# Patient Record
Sex: Female | Born: 1960
Health system: Southern US, Community
[De-identification: ages and names within clinical notes are randomized; demographics above are authoritative.]

## PROBLEM LIST (undated history)

## (undated) DIAGNOSIS — M797 Fibromyalgia: Secondary | ICD-10-CM

## (undated) DIAGNOSIS — D329 Benign neoplasm of meninges, unspecified: Secondary | ICD-10-CM

## (undated) DIAGNOSIS — M069 Rheumatoid arthritis, unspecified: Secondary | ICD-10-CM

## (undated) DIAGNOSIS — E669 Obesity, unspecified: Secondary | ICD-10-CM

## (undated) DIAGNOSIS — D332 Benign neoplasm of brain, unspecified: Secondary | ICD-10-CM

## (undated) DIAGNOSIS — M1990 Unspecified osteoarthritis, unspecified site: Secondary | ICD-10-CM

## (undated) DIAGNOSIS — G43109 Migraine with aura, not intractable, without status migrainosus: Secondary | ICD-10-CM

## (undated) DIAGNOSIS — G5712 Meralgia paresthetica, left lower limb: Secondary | ICD-10-CM

## (undated) HISTORY — DX: Unspecified osteoarthritis, unspecified site: M19.90

## (undated) HISTORY — DX: Rheumatoid arthritis, unspecified: M06.9

## (undated) HISTORY — DX: Benign neoplasm of meninges, unspecified: D32.9

## (undated) HISTORY — DX: Obesity, unspecified: E66.9

## (undated) HISTORY — PX: APPENDECTOMY: SHX54

## (undated) HISTORY — DX: Fibromyalgia: M79.7

## (undated) HISTORY — DX: Benign neoplasm of brain, unspecified: D33.2

## (undated) HISTORY — DX: Meralgia paresthetica, left lower limb: G57.12

## (undated) HISTORY — PX: PARTIAL HYSTERECTOMY: SHX80

## (undated) HISTORY — DX: Migraine with aura, not intractable, without status migrainosus: G43.109

---

## 2006-04-21 ENCOUNTER — Emergency Department (HOSPITAL_COMMUNITY): Admission: EM | Admit: 2006-04-21 | Discharge: 2006-04-21 | Payer: Self-pay | Admitting: Emergency Medicine

## 2006-04-23 ENCOUNTER — Emergency Department (HOSPITAL_COMMUNITY): Admission: EM | Admit: 2006-04-23 | Discharge: 2006-04-24 | Payer: Self-pay | Admitting: Emergency Medicine

## 2015-11-23 DIAGNOSIS — Z79899 Other long term (current) drug therapy: Secondary | ICD-10-CM | POA: Diagnosis not present

## 2015-11-23 DIAGNOSIS — M0609 Rheumatoid arthritis without rheumatoid factor, multiple sites: Secondary | ICD-10-CM | POA: Diagnosis not present

## 2015-11-23 DIAGNOSIS — R252 Cramp and spasm: Secondary | ICD-10-CM | POA: Diagnosis not present

## 2015-12-12 DIAGNOSIS — M79674 Pain in right toe(s): Secondary | ICD-10-CM | POA: Diagnosis not present

## 2015-12-12 DIAGNOSIS — L03031 Cellulitis of right toe: Secondary | ICD-10-CM | POA: Diagnosis not present

## 2015-12-12 DIAGNOSIS — M79671 Pain in right foot: Secondary | ICD-10-CM | POA: Diagnosis not present

## 2015-12-12 DIAGNOSIS — L600 Ingrowing nail: Secondary | ICD-10-CM | POA: Diagnosis not present

## 2015-12-14 DIAGNOSIS — I10 Essential (primary) hypertension: Secondary | ICD-10-CM | POA: Diagnosis not present

## 2015-12-14 DIAGNOSIS — Z882 Allergy status to sulfonamides status: Secondary | ICD-10-CM | POA: Diagnosis not present

## 2015-12-14 DIAGNOSIS — I499 Cardiac arrhythmia, unspecified: Secondary | ICD-10-CM | POA: Diagnosis not present

## 2015-12-14 DIAGNOSIS — Z888 Allergy status to other drugs, medicaments and biological substances status: Secondary | ICD-10-CM | POA: Diagnosis not present

## 2015-12-14 DIAGNOSIS — Z881 Allergy status to other antibiotic agents status: Secondary | ICD-10-CM | POA: Diagnosis not present

## 2015-12-14 DIAGNOSIS — R030 Elevated blood-pressure reading, without diagnosis of hypertension: Secondary | ICD-10-CM | POA: Diagnosis not present

## 2015-12-14 DIAGNOSIS — R0602 Shortness of breath: Secondary | ICD-10-CM | POA: Diagnosis not present

## 2015-12-14 DIAGNOSIS — R55 Syncope and collapse: Secondary | ICD-10-CM | POA: Diagnosis not present

## 2015-12-14 DIAGNOSIS — Z886 Allergy status to analgesic agent status: Secondary | ICD-10-CM | POA: Diagnosis not present

## 2015-12-26 DIAGNOSIS — K210 Gastro-esophageal reflux disease with esophagitis: Secondary | ICD-10-CM | POA: Diagnosis not present

## 2015-12-26 DIAGNOSIS — I10 Essential (primary) hypertension: Secondary | ICD-10-CM | POA: Diagnosis not present

## 2015-12-26 DIAGNOSIS — M0689 Other specified rheumatoid arthritis, multiple sites: Secondary | ICD-10-CM | POA: Diagnosis not present

## 2015-12-26 DIAGNOSIS — G59 Mononeuropathy in diseases classified elsewhere: Secondary | ICD-10-CM | POA: Diagnosis not present

## 2015-12-27 DIAGNOSIS — L600 Ingrowing nail: Secondary | ICD-10-CM | POA: Diagnosis not present

## 2015-12-27 DIAGNOSIS — M79671 Pain in right foot: Secondary | ICD-10-CM | POA: Diagnosis not present

## 2015-12-27 DIAGNOSIS — L03031 Cellulitis of right toe: Secondary | ICD-10-CM | POA: Diagnosis not present

## 2015-12-27 DIAGNOSIS — M79674 Pain in right toe(s): Secondary | ICD-10-CM | POA: Diagnosis not present

## 2016-01-02 DIAGNOSIS — M79671 Pain in right foot: Secondary | ICD-10-CM | POA: Diagnosis not present

## 2016-01-02 DIAGNOSIS — M79674 Pain in right toe(s): Secondary | ICD-10-CM | POA: Diagnosis not present

## 2016-01-02 DIAGNOSIS — L600 Ingrowing nail: Secondary | ICD-10-CM | POA: Diagnosis not present

## 2016-01-02 DIAGNOSIS — L03031 Cellulitis of right toe: Secondary | ICD-10-CM | POA: Diagnosis not present

## 2016-02-11 DIAGNOSIS — Z79899 Other long term (current) drug therapy: Secondary | ICD-10-CM | POA: Diagnosis not present

## 2016-02-11 DIAGNOSIS — M797 Fibromyalgia: Secondary | ICD-10-CM | POA: Diagnosis not present

## 2016-04-15 DIAGNOSIS — G59 Mononeuropathy in diseases classified elsewhere: Secondary | ICD-10-CM | POA: Diagnosis not present

## 2016-04-15 DIAGNOSIS — L2089 Other atopic dermatitis: Secondary | ICD-10-CM | POA: Diagnosis not present

## 2016-04-15 DIAGNOSIS — M0689 Other specified rheumatoid arthritis, multiple sites: Secondary | ICD-10-CM | POA: Diagnosis not present

## 2016-04-15 DIAGNOSIS — I10 Essential (primary) hypertension: Secondary | ICD-10-CM | POA: Diagnosis not present

## 2016-04-15 DIAGNOSIS — Z6834 Body mass index (BMI) 34.0-34.9, adult: Secondary | ICD-10-CM | POA: Diagnosis not present

## 2016-05-15 DIAGNOSIS — M0609 Rheumatoid arthritis without rheumatoid factor, multiple sites: Secondary | ICD-10-CM | POA: Diagnosis not present

## 2016-05-15 DIAGNOSIS — M064 Inflammatory polyarthropathy: Secondary | ICD-10-CM | POA: Diagnosis not present

## 2016-05-15 DIAGNOSIS — Z79899 Other long term (current) drug therapy: Secondary | ICD-10-CM | POA: Diagnosis not present

## 2016-07-15 DIAGNOSIS — M0609 Rheumatoid arthritis without rheumatoid factor, multiple sites: Secondary | ICD-10-CM | POA: Diagnosis not present

## 2016-07-15 DIAGNOSIS — G59 Mononeuropathy in diseases classified elsewhere: Secondary | ICD-10-CM | POA: Diagnosis not present

## 2016-07-15 DIAGNOSIS — J440 Chronic obstructive pulmonary disease with acute lower respiratory infection: Secondary | ICD-10-CM | POA: Diagnosis not present

## 2016-07-15 DIAGNOSIS — K210 Gastro-esophageal reflux disease with esophagitis: Secondary | ICD-10-CM | POA: Diagnosis not present

## 2016-08-04 DIAGNOSIS — Z79899 Other long term (current) drug therapy: Secondary | ICD-10-CM | POA: Diagnosis not present

## 2016-08-04 DIAGNOSIS — M0609 Rheumatoid arthritis without rheumatoid factor, multiple sites: Secondary | ICD-10-CM | POA: Diagnosis not present

## 2016-08-04 DIAGNOSIS — M797 Fibromyalgia: Secondary | ICD-10-CM | POA: Diagnosis not present

## 2016-09-26 DIAGNOSIS — M0609 Rheumatoid arthritis without rheumatoid factor, multiple sites: Secondary | ICD-10-CM | POA: Diagnosis not present

## 2016-09-26 DIAGNOSIS — Z79899 Other long term (current) drug therapy: Secondary | ICD-10-CM | POA: Diagnosis not present

## 2016-09-26 DIAGNOSIS — M797 Fibromyalgia: Secondary | ICD-10-CM | POA: Diagnosis not present

## 2016-10-16 DIAGNOSIS — Z131 Encounter for screening for diabetes mellitus: Secondary | ICD-10-CM | POA: Diagnosis not present

## 2016-10-16 DIAGNOSIS — J440 Chronic obstructive pulmonary disease with acute lower respiratory infection: Secondary | ICD-10-CM | POA: Diagnosis not present

## 2016-10-16 DIAGNOSIS — K210 Gastro-esophageal reflux disease with esophagitis: Secondary | ICD-10-CM | POA: Diagnosis not present

## 2016-10-16 DIAGNOSIS — M0609 Rheumatoid arthritis without rheumatoid factor, multiple sites: Secondary | ICD-10-CM | POA: Diagnosis not present

## 2016-10-16 DIAGNOSIS — I10 Essential (primary) hypertension: Secondary | ICD-10-CM | POA: Diagnosis not present

## 2016-10-16 DIAGNOSIS — G59 Mononeuropathy in diseases classified elsewhere: Secondary | ICD-10-CM | POA: Diagnosis not present

## 2016-10-16 DIAGNOSIS — Z6837 Body mass index (BMI) 37.0-37.9, adult: Secondary | ICD-10-CM | POA: Diagnosis not present

## 2016-11-13 DIAGNOSIS — K2900 Acute gastritis without bleeding: Secondary | ICD-10-CM | POA: Diagnosis not present

## 2016-11-13 DIAGNOSIS — Z6837 Body mass index (BMI) 37.0-37.9, adult: Secondary | ICD-10-CM | POA: Diagnosis not present

## 2016-11-13 DIAGNOSIS — J440 Chronic obstructive pulmonary disease with acute lower respiratory infection: Secondary | ICD-10-CM | POA: Diagnosis not present

## 2016-11-13 DIAGNOSIS — D320 Benign neoplasm of cerebral meninges: Secondary | ICD-10-CM | POA: Diagnosis not present

## 2016-11-27 DIAGNOSIS — R51 Headache: Secondary | ICD-10-CM | POA: Diagnosis not present

## 2016-11-27 DIAGNOSIS — R110 Nausea: Secondary | ICD-10-CM | POA: Diagnosis not present

## 2016-11-27 DIAGNOSIS — R42 Dizziness and giddiness: Secondary | ICD-10-CM | POA: Diagnosis not present

## 2016-12-29 DIAGNOSIS — M0609 Rheumatoid arthritis without rheumatoid factor, multiple sites: Secondary | ICD-10-CM | POA: Diagnosis not present

## 2016-12-29 DIAGNOSIS — Z79899 Other long term (current) drug therapy: Secondary | ICD-10-CM | POA: Diagnosis not present

## 2016-12-29 DIAGNOSIS — M797 Fibromyalgia: Secondary | ICD-10-CM | POA: Diagnosis not present

## 2016-12-30 ENCOUNTER — Ambulatory Visit: Payer: BLUE CROSS/BLUE SHIELD | Admitting: Neurology

## 2017-01-21 ENCOUNTER — Ambulatory Visit: Payer: BLUE CROSS/BLUE SHIELD | Admitting: Neurology

## 2017-01-27 ENCOUNTER — Encounter: Payer: Self-pay | Admitting: *Deleted

## 2017-01-27 ENCOUNTER — Ambulatory Visit (INDEPENDENT_AMBULATORY_CARE_PROVIDER_SITE_OTHER): Payer: BLUE CROSS/BLUE SHIELD | Admitting: Neurology

## 2017-01-27 ENCOUNTER — Encounter: Payer: Self-pay | Admitting: Neurology

## 2017-01-27 DIAGNOSIS — G43119 Migraine with aura, intractable, without status migrainosus: Secondary | ICD-10-CM | POA: Diagnosis not present

## 2017-01-27 DIAGNOSIS — G5712 Meralgia paresthetica, left lower limb: Secondary | ICD-10-CM

## 2017-01-27 DIAGNOSIS — G43109 Migraine with aura, not intractable, without status migrainosus: Secondary | ICD-10-CM

## 2017-01-27 DIAGNOSIS — D329 Benign neoplasm of meninges, unspecified: Secondary | ICD-10-CM

## 2017-01-27 HISTORY — DX: Migraine with aura, not intractable, without status migrainosus: G43.109

## 2017-01-27 HISTORY — DX: Meralgia paresthetica, left lower limb: G57.12

## 2017-01-27 HISTORY — DX: Benign neoplasm of meninges, unspecified: D32.9

## 2017-01-27 MED ORDER — SUMATRIPTAN SUCCINATE 100 MG PO TABS: 100.0000 mg | ORAL_TABLET | Freq: Two times a day (BID) | ORAL | 3 refills | Status: DC | PRN

## 2017-01-27 MED ORDER — TOPIRAMATE 25 MG PO TABS: ORAL_TABLET | ORAL | 3 refills | Status: DC

## 2017-01-27 NOTE — Patient Instructions (Signed)
   We will start Topamax to take daily to prevent the headache, and Imitrex to take when the headache occurs.  Topamax (topiramate) is a seizure medication that has an FDA approval for seizures and for migraine headache. Potential side effects of this medication include weight loss, cognitive slowing, tingling in the fingers and toes, and carbonated drinks will taste bad. If any significant side effects are noted on this drug, please contact our office.

## 2017-01-27 NOTE — Progress Notes (Signed)
Reason for visit: Migraine headache  Referring physician: Dr. Hassan Rowan April [name] is a 56 y.o. female  History of present illness:  April [name] is a 56 year old right-handed white female with a history of headaches that began about one year ago. She also carries a diagnosis of rheumatoid arthritis and fibromyalgia on Lyrica. She claims that her father also had severe headaches. She indicates that her headaches are occurring about 2 or 3 times a week on average and that the headaches may last several hours. The patient has noted that if she is able to lie down and take a nap it will help the headache go away. At times, she may have more than one headache in the day. The headaches are usually starting on the left frontotemporal area and may include the back of the head without neck stiffness. The patient describes the pain as a sharp discomfort associated with nausea, and occasional dizziness with true vertigo. The patient may be somewhat staggery when she is dizzy, otherwise her balance is normal. The patient reports some other issues with numbness in the fourth and fifth fingers of the left hand, she also has numbness and discomfort in the anterolateral aspect of the left leg. She does report some back pain and occasional numbness and discomfort down below the knee on the left. The patient has undergone MRI of the brain that was done at [facility] about a year ago that revealed a small left frontoparietal meningioma without compression of the brain. The MRI was reviewed on line. The patient reports no true weakness. She indicates that she drinks 6-8 glasses of iced tea daily. She denies any activating factors for her headache. She is sent to this office for an evaluation. With the headaches, the patient will have a visual component with a circle in the center the vision that will expand and then fade away. This occurs during the headache, not before.  Past Medical History:  Diagnosis Date  .  Arthritis   . Brain tumor (benign) ([place])   . Fibromyalgia     Past Surgical History:  Procedure Laterality Date  . APPENDECTOMY    . PARTIAL HYSTERECTOMY      Family History  Problem Relation Age of Onset  . COPD Mother   . COPD Father     Social history:  reports that she has quit smoking. Her smoking use included Cigarettes. She has never used smokeless tobacco. She reports that she does not drink alcohol or use drugs.  Medications:  Prior to Admission medications   Not on File      Allergies  Allergen Reactions  . Aspirin Hives  . Cephalexin Hives  . Erythromycin Base Hives  . Gabapentin     Memory difficulty  . Sulfa Antibiotics Hives    ROS:  Out of a complete 14 system review of symptoms, the patient complains only of the following symptoms, and all other reviewed systems are negative.  Swelling the legs Blurred vision Incontinence of the bladder, stress incontinence Easy bruising Allergies, runny nose Numbness, dizziness Decreased energy, hallucinations Restless legs  Blood pressure (!) 146/83, pulse 86, height 5' 1.75" (1.568 m), weight 193 lb 8 oz (87.8 kg).  Physical Exam  General: The patient is alert and cooperative at the time of the examination. The patient is moderately obese.  Eyes: Pupils are equal, round, and reactive to light. Discs are flat bilaterally.  Neck: The neck is supple, no carotid bruits are noted.  Respiratory: The  respiratory examination is clear.  Cardiovascular: The cardiovascular examination reveals a regular rate and rhythm, no obvious murmurs or rubs are noted.  Neuromuscular: Range of movement the cervical spine is full, no crepitus is noted in the temporomandibular joints.  Skin: Extremities are without significant edema.  Neurologic Exam  Mental status: The patient is alert and oriented x 3 at the time of the examination. The patient has apparent normal recent and remote memory, with an apparently normal  attention span and concentration ability.  Cranial nerves: Facial symmetry is present. There is good sensation of the face to pinprick and soft touch bilaterally. The strength of the facial muscles and the muscles to head turning and shoulder shrug are normal bilaterally. Speech is well enunciated, no aphasia or dysarthria is noted. Extraocular movements are full. Visual fields are full. The tongue is midline, and the patient has symmetric elevation of the soft palate. No obvious hearing deficits are noted.  Motor: The motor testing reveals 5 over 5 strength of all 4 extremities. Good symmetric motor tone is noted throughout.  Sensory: Sensory testing is intact to pinprick, soft touch, vibration sensation, and position sense on all 4 extremities, with exception of some decreased pinprick sensation on the left leg below the knee. No evidence of extinction is noted.  Coordination: Cerebellar testing reveals good finger-nose-finger and heel-to-shin bilaterally.  Gait and station: Gait is associated with a limping quality on the left leg. Tandem gait is normal. Romberg is negative. No drift is seen.  Reflexes: Deep tendon reflexes are symmetric and normal bilaterally. Toes are downgoing bilaterally.   Assessment/Plan:  1. Classic migraine headache  2. Left meralgia paresthetica  The patient is having episodic migraine headaches. She will be placed on Topamax working up to 75 mg daily dose, Imitrex will be given to take if needed. The patient is drinking 6-8 glasses of iced tea daily, I have asked her to cut back on this. The patient will follow-up in 3 months. She will call for any dose adjustments of the medication.  April Alexanders MD 01/27/2017 9:31 AM  [facility] [address] [place], [place] [address]  Phone [phone] Fax [phone]

## 2017-02-19 DIAGNOSIS — J440 Chronic obstructive pulmonary disease with acute lower respiratory infection: Secondary | ICD-10-CM | POA: Diagnosis not present

## 2017-02-19 DIAGNOSIS — Z6836 Body mass index (BMI) 36.0-36.9, adult: Secondary | ICD-10-CM | POA: Diagnosis not present

## 2017-02-19 DIAGNOSIS — I10 Essential (primary) hypertension: Secondary | ICD-10-CM | POA: Diagnosis not present

## 2017-02-20 DIAGNOSIS — R2689 Other abnormalities of gait and mobility: Secondary | ICD-10-CM | POA: Diagnosis not present

## 2017-02-20 DIAGNOSIS — M797 Fibromyalgia: Secondary | ICD-10-CM | POA: Diagnosis not present

## 2017-02-20 DIAGNOSIS — M545 Low back pain: Secondary | ICD-10-CM | POA: Diagnosis not present

## 2017-02-20 DIAGNOSIS — M0609 Rheumatoid arthritis without rheumatoid factor, multiple sites: Secondary | ICD-10-CM | POA: Diagnosis not present

## 2017-03-13 ENCOUNTER — Telehealth: Payer: Self-pay | Admitting: Neurology

## 2017-03-13 MED ORDER — PROPRANOLOL HCL 20 MG PO TABS: 20.0000 mg | ORAL_TABLET | Freq: Two times a day (BID) | ORAL | 3 refills | Status: DC

## 2017-03-13 NOTE — Telephone Encounter (Signed)
I called patient. The patient has had diarrhea on the Topamax at 75 mg dosing. She will taper by 25 mg every 5 days until she stops the medication, I will start low-dose propranolol for the headache.  The patient claims that the Topamax did help the headache.

## 2017-03-13 NOTE — Telephone Encounter (Signed)
LMVM for pt that returned her call.  Will forward to Dr. Jannifer Franklin as well.

## 2017-03-13 NOTE — Telephone Encounter (Signed)
Pt called the clinic she is having severe diarreah with topiramate (TOPAMAX) 25 MG tablet. Please call.

## 2017-04-02 DIAGNOSIS — M0609 Rheumatoid arthritis without rheumatoid factor, multiple sites: Secondary | ICD-10-CM | POA: Diagnosis not present

## 2017-04-02 DIAGNOSIS — R2689 Other abnormalities of gait and mobility: Secondary | ICD-10-CM | POA: Diagnosis not present

## 2017-04-02 DIAGNOSIS — M064 Inflammatory polyarthropathy: Secondary | ICD-10-CM | POA: Diagnosis not present

## 2017-04-02 DIAGNOSIS — Z79899 Other long term (current) drug therapy: Secondary | ICD-10-CM | POA: Diagnosis not present

## 2017-04-02 DIAGNOSIS — M5136 Other intervertebral disc degeneration, lumbar region: Secondary | ICD-10-CM | POA: Diagnosis not present

## 2017-04-20 DIAGNOSIS — M5416 Radiculopathy, lumbar region: Secondary | ICD-10-CM | POA: Diagnosis not present

## 2017-04-20 DIAGNOSIS — M4726 Other spondylosis with radiculopathy, lumbar region: Secondary | ICD-10-CM | POA: Diagnosis not present

## 2017-04-20 DIAGNOSIS — M791 Myalgia: Secondary | ICD-10-CM | POA: Diagnosis not present

## 2017-04-21 ENCOUNTER — Encounter: Payer: Self-pay | Admitting: Adult Health

## 2017-04-30 ENCOUNTER — Ambulatory Visit: Payer: BLUE CROSS/BLUE SHIELD | Admitting: Adult Health

## 2017-05-04 ENCOUNTER — Encounter: Payer: Self-pay | Admitting: Adult Health

## 2017-05-04 ENCOUNTER — Ambulatory Visit (INDEPENDENT_AMBULATORY_CARE_PROVIDER_SITE_OTHER): Payer: BLUE CROSS/BLUE SHIELD | Admitting: Adult Health

## 2017-05-04 VITALS — BP 136/72 | HR 72 | Ht 61.75 in | Wt 191.6 lb

## 2017-05-04 DIAGNOSIS — R251 Tremor, unspecified: Secondary | ICD-10-CM | POA: Diagnosis not present

## 2017-05-04 DIAGNOSIS — G43019 Migraine without aura, intractable, without status migrainosus: Secondary | ICD-10-CM

## 2017-05-04 DIAGNOSIS — M21372 Foot drop, left foot: Secondary | ICD-10-CM | POA: Diagnosis not present

## 2017-05-04 MED ORDER — TOPIRAMATE 25 MG PO TABS: 75.0000 mg | ORAL_TABLET | Freq: Every day | ORAL | 3 refills | Status: DC

## 2017-05-04 NOTE — Patient Instructions (Signed)
Your Plan:  Increase Topamax 75 mg daily. If diarrhea starts then we will switch you to propranolol  Will have Dr. Jannifer Franklin review MRI in regards to Tremor.  If your symptoms worsen or you develop new symptoms please let us know.   Thank you for coming to see [place] at [facility]. I hope we have been able to provide you high quality care today.  You may receive a patient satisfaction survey over the next few weeks. We would appreciate your feedback and comments so that we may continue to improve ourselves and the health of our patients.

## 2017-05-04 NOTE — Progress Notes (Signed)
I have read the note, and I agree with the clinical assessment and plan.  Fleur Audino KEITH   

## 2017-05-04 NOTE — Progress Notes (Signed)
PATIENT: April [name] DOB: 19-Aug-1960  REASON FOR VISIT: follow up HISTORY FROM: patient  HISTORY OF PRESENT ILLNESS: Today 05/04/17 April [name] is a 56 year old female with a history of headaches. She returns today for follow-up. She was started on Topamax 75 mg at bedtime. She reports that she tolerated the first bottle of bmedication well. However when she got her second prescription she developed diarrhea on this medication. According to the phone note she was instructed to wean off the Topamax and start propranolol however the patient did not start propranolol but rather decreased Topamax to 50 mg at bedtime. She reports that she is tolerating this well. She states that she does feel that it helped with her headache frequency and severity. She states that when she does get a headache it can last 4- 5 days. She tends to have dizziness as well as nausea, photophobia and phonophobia with her headaches. She states that if she can lay down in a dark room and sleep her headache typically will resolve. She did try Imitrex but it caused severe nausea and vomiting. She also states that she has a tremor in the right arm. She states this is been going on for quite some time. Reports that her rheumatologist noticed that and advised that she should discuss with neurology. The patient denies any history of tremor. The patient feels that the tremor occurs at any time. Not just with action. She does feel that he gets worse when she is out and about. The patient also states that she has weakness in the left leg. She states that she continues to have numbness in the left thigh. She uses a cane when ambulating. Reports that she was sent to pain management and they completed MRI of the lumbar spine. I have not reviewed the report or images. She returns today for an evaluation.  HISTORY 01/27/17: April [name] is a 56 year old right-handed white female with a history of headaches that began about one year ago. She also  carries a diagnosis of rheumatoid arthritis and fibromyalgia on Lyrica. She claims that her father also had severe headaches. She indicates that her headaches are occurring about 2 or 3 times a week on average and that the headaches may last several hours. The patient has noted that if she is able to lie down and take a nap it will help the headache go away. At times, she may have more than one headache in the day. The headaches are usually starting on the left frontotemporal area and may include the back of the head without neck stiffness. The patient describes the pain as a sharp discomfort associated with nausea, and occasional dizziness with true vertigo. The patient may be somewhat staggery when she is dizzy, otherwise her balance is normal. The patient reports some other issues with numbness in the fourth and fifth fingers of the left hand, she also has numbness and discomfort in the anterolateral aspect of the left leg. She does report some back pain and occasional numbness and discomfort down below the knee on the left. The patient has undergone MRI of the brain that was done at [facility] about a year ago that revealed a small left frontoparietal meningioma without compression of the brain. The MRI was reviewed on line. The patient reports no true weakness. She indicates that she drinks 6-8 glasses of iced tea daily. She denies any activating factors for her headache. She is sent to this office for an evaluation. With the headaches, the patient  will have a visual component with a circle in the center the vision that will expand and then fade away. This occurs during the headache, not before.  REVIEW OF SYSTEMS: Out of a complete 14 system review of symptoms, the patient complains only of the following symptoms, and all other reviewed systems are negative.  See history of present illness  ALLERGIES: Allergies  Allergen Reactions  . Aspirin Hives  . Cephalexin Hives  . Erythromycin Base  Hives  . Gabapentin     Memory difficulty  . Sulfa Antibiotics Hives    HOME MEDICATIONS: Outpatient Medications Prior to Visit  Medication Sig Dispense Refill  . albuterol (PROAIR HFA) 108 (90 Base) MCG/ACT inhaler 2 puffs 4 (four) times daily.    . cyclobenzaprine (FLEXERIL) 5 MG tablet Take 5 mg by mouth at bedtime.    . famotidine (PEPCID) 20 MG tablet Take 20 mg by mouth. Take two tablets daily.    . folic acid (FOLVITE) 1 MG tablet Take 1 mg by mouth daily.    . hydroxychloroquine (PLAQUENIL) 200 MG tablet hydroxychloroquine 200 mg tablet  Take 2 tablets every day by oral route.    Marland Kitchen ipratropium-albuterol (DUONEB) 0.5-2.5 (3) MG/3ML SOLN 4 (four) times daily.    . methotrexate (RHEUMATREX) 2.5 MG tablet Take 25 mg by mouth once a week.    . Omega-3 Fatty Acids (FISH OIL PO) Take 1,400 mg by mouth. Take two tablets daily.    . pregabalin (LYRICA) 75 MG capsule Lyrica 75 mg capsule  Take 1 capsule 4 times a day by oral route.    . propranolol (INDERAL) 20 MG tablet Take 1 tablet (20 mg total) by mouth 2 (two) times daily. 60 tablet 3  . predniSONE (DELTASONE) 1 MG tablet prednisone 1 mg tablet  Take 1-2 tabs po daily    . SUMAtriptan (IMITREX) 100 MG tablet Take 1 tablet (100 mg total) by mouth 2 (two) times daily as needed for migraine. 10 tablet 3   No facility-administered medications prior to visit.     PAST MEDICAL HISTORY: Past Medical History:  Diagnosis Date  . Arthritis   . Brain tumor (benign) ([place])   . Classic migraine with aura 01/27/2017  . Fibromyalgia   . Meningioma ([place]) 01/27/2017  . Meralgia paresthetica of left side 01/27/2017  . Obesity   . Rheumatoid arthritis ([place])     PAST SURGICAL HISTORY: Past Surgical History:  Procedure Laterality Date  . APPENDECTOMY    . PARTIAL HYSTERECTOMY      FAMILY HISTORY: Family History  Problem Relation Age of Onset  . COPD Mother   . COPD Father   . Migraines Father     SOCIAL HISTORY: Social History    Social History  . Marital status: Married    Spouse name: N/A  . Number of children: 3  . Years of education: 7th grade   Occupational History  . Homemaker    Social History Main Topics  . Smoking status: Former Smoker    Types: Cigarettes  . Smokeless tobacco: Never Used     Comment: Quit 6 months  . Alcohol use No  . Drug use: No  . Sexual activity: Not on file   Other Topics Concern  . Not on file   Social History Narrative   Lives at home with husband.   Right-handed.   4 glasses of tea per day.      PHYSICAL EXAM  Vitals:   05/04/17 0723  BP: 136/72  Pulse: 72  Weight: 191 lb 9.6 oz (86.9 kg)  Height: 5' 1.75" (1.568 m)   Body mass index is 35.33 kg/m.  Generalized: Well developed, in no acute distress   Neurological examination  Mentation: Alert oriented to time, place, history taking. Follows all commands speech and language fluent Cranial nerve II-XII: Pupils were equal round reactive to light. Extraocular movements were full, visual field were full on confrontational test. Facial sensation and strength were normal. Uvula tongue midline. Head turning and shoulder shrug  were normal and symmetric. Motor: The motor testing reveals 5 over 5 strength in the LUE, RUE, RLE. 4/5 strength in the LLE. Mild foot drop in the left. Sensory: Sensory testing is intact to soft touch on all 4 extremities. No evidence of extinction is noted.  Coordination: Cerebellar testing reveals good finger-nose-finger and heel-to-shin bilaterally.  Gait and station: Patient uses a cane when ambulating. Slight limping on the left leg. Tandem gait not attempted. Reflexes: Deep tendon reflexes are symmetric and normal bilaterally.   DIAGNOSTIC DATA (LABS, IMAGING, TESTING) - I reviewed patient records, labs, notes, testing and imaging myself where available.    ASSESSMENT AND PLAN 56 y.o. year old female  has a past medical history of Arthritis; Brain tumor (benign) ([place]);  Classic migraine with aura (01/27/2017); Fibromyalgia; Meningioma ([place]) (01/27/2017); Meralgia paresthetica of left side (01/27/2017); Obesity; and Rheumatoid arthritis ([place]). here with  1. Migraine 2. Tremor 3. left foot drop  The patient would like to try increasing Topamax 75 mg to see if it offers any benefit with her migraines. I advised that if she develops diarrhea this medication will have to be discontinued and she will be started on propranolol. She is amenable to this plan. We will follow her tremor conservatively. I did not notice a tremor on exam today. I did discuss with Dr. Jannifer Franklin in regards to her MRI that did show a small meningioma. At this time we do not feel that this is the cause of the tremor. The patient also has a left foot drop. Apparently pain management has completed an MRI of the lumbar spine. According to the patient they are managing these symptoms at this time. I advised that if she could have her imaging and notes sent to Dr. Jannifer Franklin if she would like this evaluated by our office. She voiced understanding. She will follow-up in 4 months or sooner if needed.   Lacasse Givens, MSN, NP-C 05/04/2017, 7:30 AM [facility] [address] [place],  [address] [phone]

## 2017-05-14 ENCOUNTER — Encounter: Payer: Self-pay | Admitting: Adult Health

## 2017-05-14 ENCOUNTER — Telehealth: Payer: Self-pay | Admitting: Adult Health

## 2017-05-14 DIAGNOSIS — M545 Low back pain: Secondary | ICD-10-CM | POA: Diagnosis not present

## 2017-05-14 DIAGNOSIS — Z79891 Long term (current) use of opiate analgesic: Secondary | ICD-10-CM | POA: Diagnosis not present

## 2017-05-14 NOTE — Telephone Encounter (Signed)
Called patient LVM. Have questions regarding her email.

## 2017-05-14 NOTE — Telephone Encounter (Signed)
Pt returned NP's call. °

## 2017-05-18 NOTE — Telephone Encounter (Signed)
I called the patient left VM advising her to call the office.

## 2017-05-19 MED ORDER — TOPIRAMATE 25 MG PO TABS: 50.0000 mg | ORAL_TABLET | Freq: Every day | ORAL | 3 refills | Status: DC

## 2017-05-19 NOTE — Telephone Encounter (Signed)
I called the patient. She does state that she has asthma. For that reason we will not start propranolol. She reports that she reduced Topamax to 50 mg and the diarrhea has stopped. She also states that this is controlling her headaches. For now she will remain on Topamax 50 mg if her headache frequency increases she will let us know.

## 2017-05-19 NOTE — Addendum Note (Signed)
Addended by: Trudie Buckler on: 05/19/2017 04:05 PM   Modules accepted: Orders

## 2017-05-19 NOTE — Telephone Encounter (Signed)
Pt has returned the call to NP [facility].  I relayed message to pt from [facility] that she will call her after 3:30.  Pt okayed

## 2017-06-12 DIAGNOSIS — M791 Myalgia, unspecified site: Secondary | ICD-10-CM | POA: Diagnosis not present

## 2017-06-12 DIAGNOSIS — M4726 Other spondylosis with radiculopathy, lumbar region: Secondary | ICD-10-CM | POA: Diagnosis not present

## 2017-06-12 DIAGNOSIS — M5416 Radiculopathy, lumbar region: Secondary | ICD-10-CM | POA: Diagnosis not present

## 2017-06-12 DIAGNOSIS — M5136 Other intervertebral disc degeneration, lumbar region: Secondary | ICD-10-CM | POA: Diagnosis not present

## 2017-06-12 DIAGNOSIS — Z79899 Other long term (current) drug therapy: Secondary | ICD-10-CM | POA: Diagnosis not present

## 2017-06-12 DIAGNOSIS — M0609 Rheumatoid arthritis without rheumatoid factor, multiple sites: Secondary | ICD-10-CM | POA: Diagnosis not present

## 2017-06-12 DIAGNOSIS — M064 Inflammatory polyarthropathy: Secondary | ICD-10-CM | POA: Diagnosis not present

## 2017-06-12 DIAGNOSIS — M797 Fibromyalgia: Secondary | ICD-10-CM | POA: Diagnosis not present

## 2017-08-06 DIAGNOSIS — Z79891 Long term (current) use of opiate analgesic: Secondary | ICD-10-CM | POA: Diagnosis not present

## 2017-08-06 DIAGNOSIS — Z79899 Other long term (current) drug therapy: Secondary | ICD-10-CM | POA: Diagnosis not present

## 2017-08-06 DIAGNOSIS — M0609 Rheumatoid arthritis without rheumatoid factor, multiple sites: Secondary | ICD-10-CM | POA: Diagnosis not present

## 2017-08-06 DIAGNOSIS — M791 Myalgia, unspecified site: Secondary | ICD-10-CM | POA: Diagnosis not present

## 2017-08-06 DIAGNOSIS — Z111 Encounter for screening for respiratory tuberculosis: Secondary | ICD-10-CM | POA: Diagnosis not present

## 2017-08-06 DIAGNOSIS — M064 Inflammatory polyarthropathy: Secondary | ICD-10-CM | POA: Diagnosis not present

## 2017-08-06 DIAGNOSIS — M5416 Radiculopathy, lumbar region: Secondary | ICD-10-CM | POA: Diagnosis not present

## 2017-08-06 DIAGNOSIS — Z1159 Encounter for screening for other viral diseases: Secondary | ICD-10-CM | POA: Diagnosis not present

## 2017-08-06 DIAGNOSIS — M4726 Other spondylosis with radiculopathy, lumbar region: Secondary | ICD-10-CM | POA: Diagnosis not present

## 2017-09-07 ENCOUNTER — Encounter: Payer: Self-pay | Admitting: Adult Health

## 2017-09-07 ENCOUNTER — Ambulatory Visit: Payer: BLUE CROSS/BLUE SHIELD | Admitting: Adult Health

## 2017-09-07 VITALS — Ht 61.75 in | Wt 189.8 lb

## 2017-09-07 DIAGNOSIS — R251 Tremor, unspecified: Secondary | ICD-10-CM

## 2017-09-07 DIAGNOSIS — G43009 Migraine without aura, not intractable, without status migrainosus: Secondary | ICD-10-CM | POA: Diagnosis not present

## 2017-09-07 DIAGNOSIS — Z86011 Personal history of benign neoplasm of the brain: Secondary | ICD-10-CM

## 2017-09-07 MED ORDER — TOPIRAMATE 25 MG PO TABS: 50.0000 mg | ORAL_TABLET | Freq: Every day | ORAL | 11 refills | Status: DC

## 2017-09-07 NOTE — Progress Notes (Signed)
PATIENT: April [name] DOB: 03-04-1961  REASON FOR VISIT: follow up HISTORY FROM: patient  HISTORY OF PRESENT ILLNESS: Today 09/07/17 April [name] is a 57 year old female with a history of headaches.  She returns today for follow-up.  She states that she is able to tolerate Topamax 50 mg at bedtime.  She states that this controls her headache.  She only has approximately 1 headache a month and is very mild.  She states with her more severe headache she will have photophobia, phonophobia and nausea.  She states that she was unable to tolerate Imitrex.  She continues to have a tremor that affects the right arm.  She states the tremor is most notable when she is out in public typically in a crowd of people.  She does report that she has noticed that at home or more commonly when she is in public.  She returns today for follow-up.  HISTORY 05/04/17 April [name] is a 57 year old female with a history of headaches. She returns today for follow-up. She was started on Topamax 75 mg at bedtime. She reports that she tolerated the first bottle of bmedication well. However when she got her second prescription she developed diarrhea on this medication. According to the phone note she was instructed to wean off the Topamax and start propranolol however the patient did not start propranolol but rather decreased Topamax to 50 mg at bedtime. She reports that she is tolerating this well. She states that she does feel that it helped with her headache frequency and severity. She states that when she does get a headache it can last 4- 5 days. She tends to have dizziness as well as nausea, photophobia and phonophobia with her headaches. She states that if she can lay down in a dark room and sleep her headache typically will resolve. She did try Imitrex but it caused severe nausea and vomiting. She also states that she has a tremor in the right arm. She states this is been going on for quite some time. Reports that her  rheumatologist noticed that and advised that she should discuss with neurology. The patient denies any history of tremor. The patient feels that the tremor occurs at any time. Not just with action. She does feel that he gets worse when she is out and about. The patient also states that she has weakness in the left leg. She states that she continues to have numbness in the left thigh. She uses a cane when ambulating. Reports that she was sent to pain management and they completed MRI of the lumbar spine. I have not reviewed the report or images. She returns today for an evaluation.   REVIEW OF SYSTEMS: Out of a complete 14 system review of symptoms, the patient complains only of the following symptoms, and all other reviewed systems are negative.  ALLERGIES: Allergies  Allergen Reactions  . Aspirin Hives  . Cephalexin Hives  . Erythromycin Base Hives  . Gabapentin     Memory difficulty  . Sulfa Antibiotics Hives    HOME MEDICATIONS: Outpatient Medications Prior to Visit  Medication Sig Dispense Refill  . albuterol (PROAIR HFA) 108 (90 Base) MCG/ACT inhaler 2 puffs 4 (four) times daily.    . cyclobenzaprine (FLEXERIL) 5 MG tablet Take 5 mg by mouth at bedtime.    . famotidine (PEPCID) 20 MG tablet Take 20 mg by mouth. Take two tablets daily.    . folic acid (FOLVITE) 1 MG tablet Take 1 mg by mouth daily.    Marland Kitchen  HYDROcodone-acetaminophen (NORCO) 5-325 MG tablet Taking 1 TID PRN    . hydroxychloroquine (PLAQUENIL) 200 MG tablet hydroxychloroquine 200 mg tablet  Take 2 tablets every day by oral route.    Marland Kitchen ipratropium-albuterol (DUONEB) 0.5-2.5 (3) MG/3ML SOLN 4 (four) times daily.    . methotrexate (RHEUMATREX) 2.5 MG tablet Take 25 mg by mouth once a week.    . Omega-3 Fatty Acids (FISH OIL PO) Take 1,400 mg by mouth. Take two tablets daily.    . predniSONE (DELTASONE) 5 MG tablet Take 5 mg by mouth daily.    . pregabalin (LYRICA) 75 MG capsule Lyrica 75 mg capsule  Take 1 capsule 4 times  a day by oral route.    . topiramate (TOPAMAX) 25 MG tablet Take 2 tablets (50 mg total) by mouth at bedtime. (Patient not taking: Reported on 09/07/2017) 90 tablet 3   No facility-administered medications prior to visit.     PAST MEDICAL HISTORY: Past Medical History:  Diagnosis Date  . Arthritis   . Brain tumor (benign) ([place])   . Classic migraine with aura 01/27/2017  . Fibromyalgia   . Meningioma ([place]) 01/27/2017  . Meralgia paresthetica of left side 01/27/2017  . Obesity   . Rheumatoid arthritis ([place])     PAST SURGICAL HISTORY: Past Surgical History:  Procedure Laterality Date  . APPENDECTOMY    . PARTIAL HYSTERECTOMY      FAMILY HISTORY: Family History  Problem Relation Age of Onset  . COPD Mother   . COPD Father   . Migraines Father     SOCIAL HISTORY: Social History   Socioeconomic History  . Marital status: Married    Spouse name: Not on file  . Number of children: 3  . Years of education: 7th grade  . Highest education level: Not on file  Social Needs  . Financial resource strain: Not on file  . Food insecurity - worry: Not on file  . Food insecurity - inability: Not on file  . Transportation needs - medical: Not on file  . Transportation needs - non-medical: Not on file  Occupational History  . Occupation: Homemaker  Tobacco Use  . Smoking status: Former Smoker    Types: Cigarettes  . Smokeless tobacco: Never Used  . Tobacco comment: Quit 6 months  Substance and Sexual Activity  . Alcohol use: No  . Drug use: No  . Sexual activity: Not on file  Other Topics Concern  . Not on file  Social History Narrative   Lives at home with husband.   Right-handed.   4 glasses of tea per day.      PHYSICAL EXAM  Vitals:   09/07/17 1321  Weight: 189 lb 12.8 oz (86.1 kg)  Height: 5' 1.75" (1.568 m)   Body mass index is 35 kg/m.  Generalized: Well developed, in no acute distress   Neurological examination  Mentation: Alert oriented to time, place,  history taking. Follows all commands speech and language fluent Cranial nerve II-XII: Pupils were equal round reactive to light. Extraocular movements were full, visual field were full on confrontational test. Facial sensation and strength were normal. Uvula tongue midline. Head turning and shoulder shrug  were normal and symmetric. Motor: The motor testing reveals 5 over 5 strength of all 4 extremities. Good symmetric motor tone is noted throughout.  Right foot drop Sensory: Sensory testing is intact to soft touch on all 4 extremities. No evidence of extinction is noted.  Coordination: Cerebellar testing reveals good finger-nose-finger and  heel-to-shin bilaterally.  Gait and station: Patient uses a cane when ambulating.  Slight right foot drop and affects her gait.  Tandem gait not attempted. Reflexes: Deep tendon reflexes are symmetric and normal bilaterally.   DIAGNOSTIC DATA (LABS, IMAGING, TESTING) - I reviewed patient records, labs, notes, testing and imaging myself where available.     ASSESSMENT AND PLAN 57 y.o. year old female  has a past medical history of Arthritis, Brain tumor (benign) ([place]), Classic migraine with aura (01/27/2017), Fibromyalgia, Meningioma ([place]) (01/27/2017), Meralgia paresthetica of left side (01/27/2017), Obesity, and Rheumatoid arthritis ([place]). here with:  1.  Migraine headaches 2.  History of meningioma 3. tremor  Patient will continue on Topamax 50 mg at bedtime.  Advised that if her headache frequency or severity worsen she should let us know.  In the future we will repeat an MRI of the brain to evaluate meningioma of the brain.  We will continue to monitor tremor.  She is advised that if her symptoms worsen or she develops new symptoms she should let us know.  She will follow-up in 6 months or sooner if needed.    Nachtigal Givens, MSN, NP-C 09/07/2017, 1:40 PM [facility] [address] [place], [place] [address] [phone]

## 2017-09-07 NOTE — Progress Notes (Signed)
I have read the note, and I agree with the clinical assessment and plan.  Charles K Willis   

## 2017-09-07 NOTE — Patient Instructions (Addendum)
Your Plan:  Continue Topamax  Continue to monitor tremor If your symptoms worsen or you develop new symptoms please let us know.     Thank you for coming to see [place] at [facility]. I hope we have been able to provide you high quality care today.  You may receive a patient satisfaction survey over the next few weeks. We would appreciate your feedback and comments so that we may continue to improve ourselves and the health of our patients.

## 2017-11-05 DIAGNOSIS — M4726 Other spondylosis with radiculopathy, lumbar region: Secondary | ICD-10-CM | POA: Diagnosis not present

## 2017-11-05 DIAGNOSIS — M064 Inflammatory polyarthropathy: Secondary | ICD-10-CM | POA: Diagnosis not present

## 2017-11-05 DIAGNOSIS — Z79899 Other long term (current) drug therapy: Secondary | ICD-10-CM | POA: Diagnosis not present

## 2017-11-05 DIAGNOSIS — G894 Chronic pain syndrome: Secondary | ICD-10-CM | POA: Diagnosis not present

## 2017-11-05 DIAGNOSIS — M797 Fibromyalgia: Secondary | ICD-10-CM | POA: Diagnosis not present

## 2017-11-05 DIAGNOSIS — M5136 Other intervertebral disc degeneration, lumbar region: Secondary | ICD-10-CM | POA: Diagnosis not present

## 2017-11-05 DIAGNOSIS — M791 Myalgia, unspecified site: Secondary | ICD-10-CM | POA: Diagnosis not present

## 2017-11-05 DIAGNOSIS — M0609 Rheumatoid arthritis without rheumatoid factor, multiple sites: Secondary | ICD-10-CM | POA: Diagnosis not present

## 2017-11-05 DIAGNOSIS — M5416 Radiculopathy, lumbar region: Secondary | ICD-10-CM | POA: Diagnosis not present

## 2017-12-07 DIAGNOSIS — R300 Dysuria: Secondary | ICD-10-CM | POA: Diagnosis not present

## 2017-12-07 DIAGNOSIS — N390 Urinary tract infection, site not specified: Secondary | ICD-10-CM | POA: Diagnosis not present

## 2018-02-17 DIAGNOSIS — M064 Inflammatory polyarthropathy: Secondary | ICD-10-CM | POA: Diagnosis not present

## 2018-02-17 DIAGNOSIS — M5136 Other intervertebral disc degeneration, lumbar region: Secondary | ICD-10-CM | POA: Diagnosis not present

## 2018-02-17 DIAGNOSIS — M797 Fibromyalgia: Secondary | ICD-10-CM | POA: Diagnosis not present

## 2018-02-17 DIAGNOSIS — Z79899 Other long term (current) drug therapy: Secondary | ICD-10-CM | POA: Diagnosis not present

## 2018-02-17 DIAGNOSIS — M0609 Rheumatoid arthritis without rheumatoid factor, multiple sites: Secondary | ICD-10-CM | POA: Diagnosis not present

## 2018-02-22 DIAGNOSIS — M797 Fibromyalgia: Secondary | ICD-10-CM | POA: Diagnosis not present

## 2018-02-22 DIAGNOSIS — Z79899 Other long term (current) drug therapy: Secondary | ICD-10-CM | POA: Diagnosis not present

## 2018-02-22 DIAGNOSIS — M5416 Radiculopathy, lumbar region: Secondary | ICD-10-CM | POA: Diagnosis not present

## 2018-02-22 DIAGNOSIS — M4726 Other spondylosis with radiculopathy, lumbar region: Secondary | ICD-10-CM | POA: Diagnosis not present

## 2018-02-22 DIAGNOSIS — M791 Myalgia, unspecified site: Secondary | ICD-10-CM | POA: Diagnosis not present

## 2018-03-09 ENCOUNTER — Ambulatory Visit: Payer: BLUE CROSS/BLUE SHIELD | Admitting: Adult Health

## 2018-03-09 ENCOUNTER — Encounter: Payer: Self-pay | Admitting: Adult Health

## 2018-03-09 ENCOUNTER — Telehealth: Payer: Self-pay | Admitting: Adult Health

## 2018-03-09 VITALS — BP 122/74 | HR 66 | Ht 61.75 in | Wt 200.0 lb

## 2018-03-09 DIAGNOSIS — D329 Benign neoplasm of meninges, unspecified: Secondary | ICD-10-CM | POA: Diagnosis not present

## 2018-03-09 DIAGNOSIS — H532 Diplopia: Secondary | ICD-10-CM

## 2018-03-09 DIAGNOSIS — G43009 Migraine without aura, not intractable, without status migrainosus: Secondary | ICD-10-CM

## 2018-03-09 NOTE — Progress Notes (Signed)
PATIENT: April [name] DOB: 12-19-1960  REASON FOR VISIT: follow up HISTORY FROM: patient  HISTORY OF PRESENT ILLNESS: Today 03/09/18:  April [name] is a 57 year old female with a history of headaches.  She returns today for follow-up.  She was unable to tolerate Topamax due to diarrhea.  She reports that she has stopped the medication.  She is currently having about 3-4 headaches a month.  Her headaches always occur on the left side.  She does have photophobia and nausea with her headaches.  She reports that the longevity of her headache varies.  She does use over-the-counter Aleve with moderate benefit.  She states about 3 to 4 months ago she developed double vision.  This is not a consistent problem.  She states that she can go weeks with no issues and then suddenly she will have double vision.  She has not followed up with her ophthalmologist she denies any weakness in the arms or legs.  She does report that she is always been weaker on the left side.  She returns today for evaluation.  HISTORY 09/07/17 April [name] is a 57 year old female with a history of headaches.  She returns today for follow-up.  She states that she is able to tolerate Topamax 50 mg at bedtime.  She states that this controls her headache.  She only has approximately 1 headache a month and is very mild.  She states with her more severe headache she will have photophobia, phonophobia and nausea.  She states that she was unable to tolerate Imitrex.  She continues to have a tremor that affects the right arm.  She states the tremor is most notable when she is out in public typically in a crowd of people.  She does report that she has noticed that at home or more commonly when she is in public.  She returns today for follow-up.   REVIEW OF SYSTEMS: Out of a complete 14 system review of symptoms, the patient complains only of the following symptoms, and all other reviewed systems are negative.  No vision, blurred vision, leg  swelling, diarrhea, incontinence of bladder, joint pain, joint swelling, back pain, dizziness, tremors  ALLERGIES: Allergies  Allergen Reactions  . Aspirin Hives  . Cephalexin Hives  . Erythromycin Base Hives  . Gabapentin     Memory difficulty  . Sulfa Antibiotics Hives    HOME MEDICATIONS: Outpatient Medications Prior to Visit  Medication Sig Dispense Refill  . albuterol (PROAIR HFA) 108 (90 Base) MCG/ACT inhaler 2 puffs 4 (four) times daily.    . famotidine (PEPCID) 20 MG tablet Take 20 mg by mouth. Take two tablets daily.    . folic acid (FOLVITE) 1 MG tablet Take 1 mg by mouth daily.    Marland Kitchen HYDROcodone-acetaminophen (NORCO) 5-325 MG tablet Taking 1 TID PRN    . hydroxychloroquine (PLAQUENIL) 200 MG tablet hydroxychloroquine 200 mg tablet  Take 2 tablets every day by oral route.    Marland Kitchen ipratropium-albuterol (DUONEB) 0.5-2.5 (3) MG/3ML SOLN 4 (four) times daily.    . methotrexate (RHEUMATREX) 2.5 MG tablet Take 25 mg by mouth once a week.    . predniSONE (DELTASONE) 5 MG tablet Take 5 mg by mouth daily.    . pregabalin (LYRICA) 75 MG capsule Lyrica 75 mg capsule  Take 1 capsule 4 times a day by oral route.    . cyclobenzaprine (FLEXERIL) 5 MG tablet Take 5 mg by mouth at bedtime.    . Omega-3 Fatty Acids (FISH OIL PO)  Take 1,400 mg by mouth. Take two tablets daily.    Marland Kitchen topiramate (TOPAMAX) 25 MG tablet Take 2 tablets (50 mg total) by mouth at bedtime. 60 tablet 11   No facility-administered medications prior to visit.     PAST MEDICAL HISTORY: Past Medical History:  Diagnosis Date  . Arthritis   . Brain tumor (benign) ([place])   . Classic migraine with aura 01/27/2017  . Fibromyalgia   . Meningioma ([place]) 01/27/2017  . Meralgia paresthetica of left side 01/27/2017  . Obesity   . Rheumatoid arthritis ([place])     PAST SURGICAL HISTORY: Past Surgical History:  Procedure Laterality Date  . APPENDECTOMY    . PARTIAL HYSTERECTOMY      FAMILY HISTORY: Family History  Problem  Relation Age of Onset  . COPD Mother   . COPD Father   . Migraines Father     SOCIAL HISTORY: Social History   Socioeconomic History  . Marital status: Married    Spouse name: Not on file  . Number of children: 3  . Years of education: 7th grade  . Highest education level: Not on file  Occupational History  . Occupation: Agricultural engineer  Social Needs  . Financial resource strain: Not on file  . Food insecurity:    Worry: Not on file    Inability: Not on file  . Transportation needs:    Medical: Not on file    Non-medical: Not on file  Tobacco Use  . Smoking status: Former Smoker    Types: Cigarettes  . Smokeless tobacco: Never Used  . Tobacco comment: Quit 6 months  Substance and Sexual Activity  . Alcohol use: No  . Drug use: No  . Sexual activity: Not on file  Lifestyle  . Physical activity:    Days per week: Not on file    Minutes per session: Not on file  . Stress: Not on file  Relationships  . Social connections:    Talks on phone: Not on file    Gets together: Not on file    Attends religious service: Not on file    Active member of club or organization: Not on file    Attends meetings of clubs or organizations: Not on file    Relationship status: Not on file  . Intimate partner violence:    Fear of current or ex partner: Not on file    Emotionally abused: Not on file    Physically abused: Not on file    Forced sexual activity: Not on file  Other Topics Concern  . Not on file  Social History Narrative   Lives at home with husband.   Right-handed.   4 glasses of tea per day.      PHYSICAL EXAM  Vitals:   03/09/18 0841  BP: 122/74  Pulse: 66  Weight: 200 lb (90.7 kg)  Height: 5' 1.75" (1.568 m)   Body mass index is 36.88 kg/m.  Generalized: Well developed, in no acute distress   Neurological examination  Mentation: Alert oriented to time, place, history taking. Follows all commands speech and language fluent Cranial nerve II-XII: Pupils were  equal round reactive to light. Extraocular movements were full, visual field were full on confrontational test. Facial sensation and strength were normal. Uvula tongue midline. Head turning and shoulder shrug  were normal and symmetric.  With superior gaze held for 1 minute no ptosis or diplopia noted. Motor: Good strength throughout.  Slightly weaker in the left upper extremity.  With  arms abducted for 1 minute no weakness noted. Sensory: Sensory testing is intact to soft touch on all 4 extremities. No evidence of extinction is noted.  Coordination: Cerebellar testing reveals good finger-nose-finger and heel-to-shin bilaterally.  Gait and station: Patient has a Ortho boot on the left foot.  Tandem gait not attempted. Reflexes: Deep tendon reflexes are symmetric and normal bilaterally.   DIAGNOSTIC DATA (LABS, IMAGING, TESTING) - I reviewed patient records, labs, notes, testing and imaging myself where available.     ASSESSMENT AND PLAN 57 y.o. year old female  has a past medical history of Arthritis, Brain tumor (benign) ([place]), Classic migraine with aura (01/27/2017), Fibromyalgia, Meningioma ([place]) (01/27/2017), Meralgia paresthetica of left side (01/27/2017), Obesity, and Rheumatoid arthritis ([place]). here with :  1.  Migraine headaches 2. Diplopia   The patient was unable to tolerate Topamax.  We discussed switching to Zonegran however as of now she does not want to start any new medication.  I advised the patient to continue monitoring her headaches.  If her headache Wednesday or severity worsens she will let us know.  She also has a history of brain meningioma that was found on an MRI done at [facility].  We will repeat MRI of the brain to look for progression.  She will follow-up in 6 months or sooner if needed.    Krotzer Givens, MSN, NP-C 03/09/2018, 8:59 AM [facility] [address], [place], [place] [address] [phone]

## 2018-03-09 NOTE — Telephone Encounter (Signed)
Just an FYI   The patient called me back and I informed the patient it would cost about $1,127.96 and I did offer the payment she stated she is going to have to think about it and get back to me.

## 2018-03-09 NOTE — Patient Instructions (Signed)
Your Plan:  Continue to monitor headaches If headache frequency worsens we can consider Zonegran If your symptoms worsen or you develop new symptoms please let us know.   Thank you for coming to see [place] at [facility]. I hope we have been able to provide you high quality care today.  You may receive a patient satisfaction survey over the next few weeks. We would appreciate your feedback and comments so that we may continue to improve ourselves and the health of our patients.  Zonisamide capsules What is this medicine? ZONISAMIDE (zoe NIS a mide) is used to control partial seizures in adults with epilepsy. This medicine may be used for other purposes; ask your health care provider or pharmacist if you have questions. COMMON BRAND NAME(S): Zonegran What should I tell my health care provider before I take this medicine? They need to know if you have any of these conditions: -dehydrated -diarrhea -history of metabolic acidosis (too much acid in your blood) -ketogenic diet -kidney disease -liver disease -lung disease -osteoporosis -suicidal thoughts, plans, or attempt; a previous suicide attempt by you or a family member -an unusual or allergic reaction to zonisamide, sulfa drugs, other medicines, foods, dyes, or preservatives -pregnant or trying to get pregnant -breast-feeding How should I use this medicine? Take this medicine by mouth with a glass of water. Follow the directions on the prescription label. Swallow whole. Do not break open the capsule. This medicine may be taken with or without food. Take your doses at regular intervals. Do not take your medicine more often than directed. Do not stop taking this medicine unless instructed by your doctor or health care professional. A special MedGuide will be given to you by the pharmacist with each prescription and refill. Be sure to read this information carefully each time. Talk to your pediatrician regarding the use of  this medicine in children. While this drug may be prescribed for children as young as 50 years of age for selected conditions, precautions do apply. Overdosage: If you think you have taken too much of this medicine contact a poison control center or emergency room at once. NOTE: This medicine is only for you. Do not share this medicine with others. What if I miss a dose? If you miss a dose, take it as soon as you can. If it is almost time for your next dose, take only that dose. Do not take double or extra doses. What may interact with this medicine? This medicine may interact with the following medications -alcohol -antihistamines for allergy, cough and cold -antiviral medicines for HIV or AIDS -certain antibiotics like rifabutin, rifampin -certain medicines for anxiety or sleep -certain medicines for depression like amitriptyline, fluoxetine, sertraline -certain medicines for seizures like carbamazepine, phenobarbital, phenytoin, topiramate -digoxin -diuretics like acetazolamide, dichlorphenamide -general anesthetics like halothane, isoflurane, methoxyflurane, propofol -local anesthetics like lidocaine, pramoxine, tetracaine -medicines that relax muscles for surgery -narcotic medicines for pain -phenothiazines like chlorpromazine, mesoridazine, prochlorperazine, thioridazine -quinidine This list may not describe all possible interactions. Give your health care provider a list of all the medicines, herbs, non-prescription drugs, or dietary supplements you use. Also tell them if you smoke, drink alcohol, or use illegal drugs. Some items may interact with your medicine. What should I watch for while using this medicine? Visit your doctor or health care professional for regular checks on your progress. Wear a medical identification bracelet or chain to say you have epilepsy, and carry a card that lists all your medications. It is important  to take this medicine exactly as directed. When first  starting treatment, your dose will need to be adjusted slowly. It may take weeks or months before your dose is stable. You should contact your doctor or health care professional if your seizures get worse or if you have any new types of seizures. Do not stop taking except on your doctor's advice. You may develop a severe reaction. Your doctor will tell you how much medicine to take. You may get drowsy, dizzy, or have blurred vision. Do not drive, use machinery, or do anything that needs mental alertness until you know how this medicine affects you. To reduce dizzy or fainting spells, do not sit or stand up quickly, especially if you are an older patient. Alcohol can increase drowsiness and dizziness. Avoid alcoholic drinks. Avoid extreme heat. This medicine can cause you to sweat less than normal. Your body temperature could increase to dangerous levels, which may lead to heat stroke. This medicine may increase the chance of developing metabolic acidosis. If left untreated, this can cause kidney stones, bone disease, or slowed growth in children. Symptoms include breathing fast, fatigue, loss of appetite, irregular heartbeat, or loss of consciousness. Call your doctor immediately if you experience any of these side effects. Also, tell your doctor about any surgery you plan on having while taking this medicine since this may increase your risk for metabolic acidosis. This medicines may increase the risk of kidney stones. Drinking 6 to 8 glasses of water a day may help prevent the formation of kidney stones. The use of this medicine may increase the chance of suicidal thoughts or actions. Pay special attention to how you are responding while on this medicine. Any worsening of mood, or thoughts of suicide or dying should be reported to your health care professional right away. Women who become pregnant while using this medicine may enroll in the [place] Pregnancy Registry by calling  [phone]. This registry collects information about the safety of antiepileptic drug use during pregnancy. What side effects may I notice from receiving this medicine? Side effects that you should report to your doctor or health care professional as soon as possible: -allergic reactions like skin rash, itching or hives, swelling of the face, lips, or tongue -decreased sweating or a rise in body temperature, especially in patients under 39 years old -difficulty breathing or tightening of the throat -feeling faint or lightheaded, falls -fever, sore throat, sores in your mouth, or bruising easily -hallucination, loss of contact with reality -irregular heartbeat -loss of appetite -redness, blistering, peeling or loosening of the skin, including inside the mouth -severe drowsiness, difficulty concentrating, or coordination problems -speech or language problems -sudden back pain, abdominal pain, pain when urinating, bloody or dark urine -suicidal thoughts or depression -unusual changes in behavior or mood -unusually weak or tired -vomiting Side effects that usually do not require medical attention (report to your doctor or health care professional if they continue or are bothersome): -headache -nausea This list may not describe all possible side effects. Call your doctor for medical advice about side effects. You may report side effects to FDA at 1-800-FDA-1088. Where should I keep my medicine? Keep out of reach of children. Store at room temperature between 15 and 30 degrees C (59 and 86 degrees F). Keep in a dry place protected from light. Throw away any unused medicine after the expiration date. NOTE: This sheet is a summary. It may not cover all possible information. If you have questions about  this medicine, talk to your doctor, pharmacist, or health care provider.  2018 Elsevier/Gold Standard (2015-08-16 09:50:49)

## 2018-03-09 NOTE — Telephone Encounter (Signed)
lvm for pt to call back about scheduling mri  [employer] Auth: [place] Ref # [id number]

## 2018-03-10 NOTE — Telephone Encounter (Signed)
Noted thanks °

## 2018-03-15 DIAGNOSIS — M722 Plantar fascial fibromatosis: Secondary | ICD-10-CM | POA: Diagnosis not present

## 2018-03-15 DIAGNOSIS — M25572 Pain in left ankle and joints of left foot: Secondary | ICD-10-CM | POA: Diagnosis not present

## 2018-03-18 ENCOUNTER — Telehealth: Payer: Self-pay | Admitting: Adult Health

## 2018-03-18 NOTE — Telephone Encounter (Addendum)
Called patient to discuss. She stated day before yesterday she had " a spell" of nausea with a headache. She felt hot then cold and afterwards, feelt drained. This RN asked if she had any other symptoms; she denied.  Today she denies pain, nausea and headache, but she reports feeling drained and "my head doesn't feel right".  She stated she has had these spells in the past, but it's been months ago since her last one. She stated it's the way she felt "when they found my brain tumor". She has not discussed with her PCP, but stated Megan told her to call back if she had any problems.  This RN stated will discuss with Jinny Blossom and call her back, advised Jinny Blossom may recommend she call her PCP. She verbalized understanding, appreciation.

## 2018-03-18 NOTE — Telephone Encounter (Signed)
Pt states all of a sudden she feels nausea  And has the need to immediately lay down.  Pt states when she feels like this it takes about 3 days to get over.  Pt is asking to get a call back about this

## 2018-03-18 NOTE — Telephone Encounter (Signed)
MRI brain is ordered. We will wait to see what that shows. Certainly if she has any concerning symptoms she should go to the ED.

## 2018-03-18 NOTE — Telephone Encounter (Signed)
Spoke with patient and informed her that NP has ordered MRI. She will get a call in a couple days, following insurance approval, to schedule MRI. Advised her that if she has worsening, severe and/or concerning symptoms she should go directly to the ED. Repeated this information. She verbalized understanding, appreciation, agreement.

## 2018-05-17 DIAGNOSIS — M5416 Radiculopathy, lumbar region: Secondary | ICD-10-CM | POA: Diagnosis not present

## 2018-05-17 DIAGNOSIS — G894 Chronic pain syndrome: Secondary | ICD-10-CM | POA: Diagnosis not present

## 2018-05-17 DIAGNOSIS — Z79899 Other long term (current) drug therapy: Secondary | ICD-10-CM | POA: Diagnosis not present

## 2018-05-17 DIAGNOSIS — M0609 Rheumatoid arthritis without rheumatoid factor, multiple sites: Secondary | ICD-10-CM | POA: Diagnosis not present

## 2018-05-17 DIAGNOSIS — M797 Fibromyalgia: Secondary | ICD-10-CM | POA: Diagnosis not present

## 2018-05-17 DIAGNOSIS — M791 Myalgia, unspecified site: Secondary | ICD-10-CM | POA: Diagnosis not present

## 2018-05-17 DIAGNOSIS — M5136 Other intervertebral disc degeneration, lumbar region: Secondary | ICD-10-CM | POA: Diagnosis not present

## 2018-05-17 DIAGNOSIS — Z79891 Long term (current) use of opiate analgesic: Secondary | ICD-10-CM | POA: Diagnosis not present

## 2018-05-17 DIAGNOSIS — M4726 Other spondylosis with radiculopathy, lumbar region: Secondary | ICD-10-CM | POA: Diagnosis not present

## 2018-06-11 DIAGNOSIS — M19072 Primary osteoarthritis, left ankle and foot: Secondary | ICD-10-CM | POA: Diagnosis not present

## 2018-06-11 DIAGNOSIS — M722 Plantar fascial fibromatosis: Secondary | ICD-10-CM | POA: Diagnosis not present

## 2018-08-20 DIAGNOSIS — M4726 Other spondylosis with radiculopathy, lumbar region: Secondary | ICD-10-CM | POA: Diagnosis not present

## 2018-08-20 DIAGNOSIS — M25552 Pain in left hip: Secondary | ICD-10-CM | POA: Diagnosis not present

## 2018-08-20 DIAGNOSIS — M791 Myalgia, unspecified site: Secondary | ICD-10-CM | POA: Diagnosis not present

## 2018-08-20 DIAGNOSIS — Z79899 Other long term (current) drug therapy: Secondary | ICD-10-CM | POA: Diagnosis not present

## 2018-08-20 DIAGNOSIS — M797 Fibromyalgia: Secondary | ICD-10-CM | POA: Diagnosis not present

## 2018-08-20 DIAGNOSIS — M5416 Radiculopathy, lumbar region: Secondary | ICD-10-CM | POA: Diagnosis not present

## 2018-08-20 DIAGNOSIS — M064 Inflammatory polyarthropathy: Secondary | ICD-10-CM | POA: Diagnosis not present

## 2018-08-20 DIAGNOSIS — M5136 Other intervertebral disc degeneration, lumbar region: Secondary | ICD-10-CM | POA: Diagnosis not present

## 2018-08-20 DIAGNOSIS — G894 Chronic pain syndrome: Secondary | ICD-10-CM | POA: Diagnosis not present

## 2018-08-20 DIAGNOSIS — M0609 Rheumatoid arthritis without rheumatoid factor, multiple sites: Secondary | ICD-10-CM | POA: Diagnosis not present

## 2018-08-20 DIAGNOSIS — M25562 Pain in left knee: Secondary | ICD-10-CM | POA: Diagnosis not present

## 2018-08-20 DIAGNOSIS — M25551 Pain in right hip: Secondary | ICD-10-CM | POA: Diagnosis not present

## 2018-09-20 ENCOUNTER — Encounter: Payer: Self-pay | Admitting: Neurology

## 2018-09-20 ENCOUNTER — Ambulatory Visit: Payer: BLUE CROSS/BLUE SHIELD | Admitting: Neurology

## 2018-09-20 VITALS — BP 122/74 | HR 95 | Ht 61.75 in | Wt 207.6 lb

## 2018-09-20 DIAGNOSIS — H81399 Other peripheral vertigo, unspecified ear: Secondary | ICD-10-CM

## 2018-09-20 DIAGNOSIS — R42 Dizziness and giddiness: Secondary | ICD-10-CM

## 2018-09-20 DIAGNOSIS — D329 Benign neoplasm of meninges, unspecified: Secondary | ICD-10-CM | POA: Diagnosis not present

## 2018-09-20 DIAGNOSIS — G43119 Migraine with aura, intractable, without status migrainosus: Secondary | ICD-10-CM

## 2018-09-20 NOTE — Progress Notes (Signed)
I have read the note, and I agree with the clinical assessment and plan.  Charles K Willis   

## 2018-09-20 NOTE — Progress Notes (Signed)
PATIENT: April [name] DOB: 1961-02-28  REASON FOR VISIT: follow up HISTORY FROM: patient  HISTORY OF PRESENT ILLNESS: Today 09/20/18  She was initially evaluated in July 2018 for migraine headaches and started on Topamax 75 mg daily and Imitrex. Shortly after she reported diarrhea and was told to titrate off Topamax, start low-dose propanolol. She continued to take Topamax 50 mg and was able to tolerate this dose. She was reluctant to start propanolol due to her history of asthma.  In the past she has complained of a tremor in her right arm.  An MRI was ordered in August 2019 due to a history of a brain meningioma.  She never got the MRI.  At her last office visit in August 2019 she had stopped the Topamax and we discussed switching to Zonegran.   Today she complains that for the last several months when she stands she feels dizzy and nauseated.  She also reports occasional episodes of watching TV and seeing double vision.  She reports that if she blinks the double vision will clear.  She reports that the dizziness mostly occurs when she moves from a seated to a standing position.  She also complains of feeling off balance and that she has been bumping into things for several months.  She does report that she has bad arthritis and she has been using a cane for several years.  She denies any recent falls.  She is no longer taking Topamax and she reports that she has 1 headache per month.  She reports that when she has a headache she does not take anything because she is getting Vicodin from the pain clinic and she was told not to take any over-the-counter medications.  She has history of rheumatoid arthritis, fibromyalgia..  She has history of a brain meningioma and was set up to have an MRI in August 2019 however it was never done due to cost issues.  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  She also reports that she has peripheral neuropathy.  She presents  today for follow-up accompanied by her husband.    HISTORY 03/09/2018 MM Ms. [name] is a 58 year old female with a history of headaches.  She returns today for follow-up.  She was unable to tolerate Topamax due to diarrhea.  She reports that she has stopped the medication.  She is currently having about 3-4 headaches a month.  Her headaches always occur on the left side.  She does have photophobia and nausea with her headaches.  She reports that the longevity of her headache varies.  She does use over-the-counter Aleve with moderate benefit.  She states about 3 to 4 months ago she developed double vision.  This is not a consistent problem.  She states that she can go weeks with no issues and then suddenly she will have double vision.  She has not followed up with her ophthalmologist she denies any weakness in the arms or legs.  She does report that she is always been weaker on the left side.  She returns today for evaluation.  REVIEW OF SYSTEMS: Out of a complete 14 system review of symptoms, the patient complains only of the following symptoms, and all other reviewed systems are negative.  Double vision, cough, wheezing, shortness of breath, nausea, vomiting, dizziness, headache  ALLERGIES: Allergies  Allergen Reactions  . Aspirin Hives  . Cephalexin Hives  . Erythromycin Base Hives  . Gabapentin  Memory difficulty  . Sulfa Antibiotics Hives  . Topamax [Topiramate]     Diarrhea    HOME MEDICATIONS: Outpatient Medications Prior to Visit  Medication Sig Dispense Refill  . albuterol (PROAIR HFA) 108 (90 Base) MCG/ACT inhaler 2 puffs 4 (four) times daily.    . famotidine (PEPCID) 20 MG tablet Take 20 mg by mouth. Take two tablets daily.    . folic acid (FOLVITE) 1 MG tablet Take 1 mg by mouth daily.    Marland Kitchen HYDROcodone-acetaminophen (NORCO) 5-325 MG tablet Taking 1 TID PRN    . hydroxychloroquine (PLAQUENIL) 200 MG tablet hydroxychloroquine 200 mg tablet  Take 2 tablets every day by oral  route.    Marland Kitchen ipratropium-albuterol (DUONEB) 0.5-2.5 (3) MG/3ML SOLN 4 (four) times daily.    . methotrexate (RHEUMATREX) 2.5 MG tablet Take 25 mg by mouth once a week.    . predniSONE (DELTASONE) 5 MG tablet Take 5 mg by mouth daily.    . pregabalin (LYRICA) 75 MG capsule Take 225 mg by mouth 2 (two) times daily.      No facility-administered medications prior to visit.     PAST MEDICAL HISTORY: Past Medical History:  Diagnosis Date  . Arthritis   . Brain tumor (benign) ([place])   . Classic migraine with aura 01/27/2017  . Fibromyalgia   . Meningioma ([place]) 01/27/2017  . Meralgia paresthetica of left side 01/27/2017  . Obesity   . Rheumatoid arthritis ([place])     PAST SURGICAL HISTORY: Past Surgical History:  Procedure Laterality Date  . APPENDECTOMY    . PARTIAL HYSTERECTOMY      FAMILY HISTORY: Family History  Problem Relation Age of Onset  . COPD Mother   . COPD Father   . Migraines Father     SOCIAL HISTORY: Social History   Socioeconomic History  . Marital status: Married    Spouse name: Not on file  . Number of children: 3  . Years of education: 7th grade  . Highest education level: Not on file  Occupational History  . Occupation: Agricultural engineer  Social Needs  . Financial resource strain: Not on file  . Food insecurity:    Worry: Not on file    Inability: Not on file  . Transportation needs:    Medical: Not on file    Non-medical: Not on file  Tobacco Use  . Smoking status: Former Smoker    Types: Cigarettes  . Smokeless tobacco: Never Used  . Tobacco comment: Quit 6 months  Substance and Sexual Activity  . Alcohol use: No  . Drug use: No  . Sexual activity: Not on file  Lifestyle  . Physical activity:    Days per week: Not on file    Minutes per session: Not on file  . Stress: Not on file  Relationships  . Social connections:    Talks on phone: Not on file    Gets together: Not on file    Attends religious service: Not on file    Active member of club  or organization: Not on file    Attends meetings of clubs or organizations: Not on file    Relationship status: Not on file  . Intimate partner violence:    Fear of current or ex partner: Not on file    Emotionally abused: Not on file    Physically abused: Not on file    Forced sexual activity: Not on file  Other Topics Concern  . Not on file  Social History Narrative  Lives at home with husband.   Right-handed.   4 glasses of tea per day.      PHYSICAL EXAM  Vitals:   09/20/18 1427  BP: 122/74  Pulse: 95  Weight: 207 lb 9.6 oz (94.2 kg)  Height: 5' 1.75" (1.568 m)   Body mass index is 38.28 kg/m.  Generalized: Well developed, in no acute distress  Phenacaine Neurological examination  Mentation: Alert oriented to time, place, history taking. Follows all commands speech and language fluent Cranial nerve II-XII: Pupils were equal round reactive to light. Extraocular movements were full, visual field were full on confrontational test. Facial sensation and strength were normal. Uvula tongue midline. Head turning and shoulder shrug  were normal and symmetric. Motor: The motor testing reveals 5 over 5 strength but mild weakness to left leg. Good symmetric motor tone is noted throughout.  Mild right hand action tremor Sensory: Sensory testing is intact to soft touch on all 4 extremities. No evidence of extinction is noted.  Coordination: Cerebellar testing reveals good finger-nose-finger and heel-to-shin bilaterally.  Gait and station: Gait is antalgic, assisted with cane, left foot drop. Tandem gait is normal. Romberg is negative. No drift is seen.  Reflexes: Deep tendon reflexes are symmetric and decreased bilaterally.   DIAGNOSTIC DATA (LABS, IMAGING, TESTING) - I reviewed patient records, labs, notes, testing and imaging myself where available.  No results found for: WBC, HGB, HCT, MCV, PLT No results found for: NA, K, CL, CO2, GLUCOSE, BUN, CREATININE, CALCIUM, PROT,  ALBUMIN, AST, ALT, ALKPHOS, BILITOT, GFRNONAA, GFRAA No results found for: CHOL, HDL, LDLCALC, LDLDIRECT, TRIG, CHOLHDL No results found for: HGBA1C No results found for: VITAMINB12 No results found for: TSH    ASSESSMENT AND PLAN 58 y.o. year old female  has a past medical history of Arthritis, Brain tumor (benign) ([place]), Classic migraine with aura (01/27/2017), Fibromyalgia, Meningioma ([place]) (01/27/2017), Meralgia paresthetica of left side (01/27/2017), Obesity, and Rheumatoid arthritis ([place]). here with:  1.  Migraine headache 2.  Dizziness  The patient reports good control of her migraine headaches.  She reports 1 headache per month.  She is no longer taking Topamax due to the side effect of diarrhea.  Zonegran is not an option for her, due to her sulfa allergy.  She is unable to have propanolol due to history of asthma.  We could potentially try low-dose nortriptyline or Effexor.  At this time she does not wish to start any new medications for headache.  Today she is complaining of dizziness when she stands up for the last several months.  She is also complaining of double vision when she is watching TV.  I checked orthostatic blood pressures and there was no major change (sitting 150/70, standing 140/70).  She does have history of a brain meningioma.  She was to have an MRI back in August 2019 however due to financial reasons she was unable.  I reordered the MRI today and she and her husband will try to have it done.  If the MRI is approved and they are able to pay the cost she will need a prescription for Xanax or Valium for sedation.  I am not sure the nature of the dizziness because she reports that it is not all the time. The dizziness is not correlated with a headache.  She denies any numbness or weakness.  We discussed the importance of rising from a seated position slowly.  She should be careful not to fall.  I will check lab work  today.  I advised her to see her eye doctor as she reports it  has been several years since she has been evaluated.  She will follow-up in this office in 3 months with Dr. Jannifer Franklin.  I advised her that if her symptoms worsen or she develops any new symptoms she should let us know.    Butler Denmark, AGNP-C, DNP 09/20/2018, 3:26 PM [facility] [address] [place], [place] [address] [phone]

## 2018-09-21 ENCOUNTER — Telehealth: Payer: Self-pay | Admitting: Neurology

## 2018-09-21 LAB — CBC WITH DIFFERENTIAL/PLATELET
BASOS: 0 %
Basophils Absolute: 0 10*3/uL (ref 0.0–0.2)
EOS (ABSOLUTE): 0.1 10*3/uL (ref 0.0–0.4)
EOS: 1 %
HEMATOCRIT: 39.6 % (ref 34.0–46.6)
HEMOGLOBIN: 13.9 g/dL (ref 11.1–15.9)
IMMATURE GRANS (ABS): 0.2 10*3/uL — AB (ref 0.0–0.1)
IMMATURE GRANULOCYTES: 2 %
LYMPHS: 39 %
Lymphocytes Absolute: 4.9 10*3/uL — ABNORMAL HIGH (ref 0.7–3.1)
MCH: 32.9 pg (ref 26.6–33.0)
MCHC: 35.1 g/dL (ref 31.5–35.7)
MCV: 94 fL (ref 79–97)
MONOCYTES: 8 %
Monocytes Absolute: 1 10*3/uL — ABNORMAL HIGH (ref 0.1–0.9)
NEUTROS ABS: 6.3 10*3/uL (ref 1.4–7.0)
NEUTROS PCT: 50 %
PLATELETS: 260 10*3/uL (ref 150–450)
RBC: 4.23 x10E6/uL (ref 3.77–5.28)
RDW: 14 % (ref 11.7–15.4)
WBC: 12.5 10*3/uL — ABNORMAL HIGH (ref 3.4–10.8)

## 2018-09-21 LAB — COMPREHENSIVE METABOLIC PANEL
A/G RATIO: 1.8 (ref 1.2–2.2)
ALBUMIN: 4.2 g/dL (ref 3.8–4.9)
ALT: 18 IU/L (ref 0–32)
AST: 25 IU/L (ref 0–40)
Alkaline Phosphatase: 84 IU/L (ref 39–117)
BUN / CREAT RATIO: 14 (ref 9–23)
BUN: 13 mg/dL (ref 6–24)
Bilirubin Total: 0.3 mg/dL (ref 0.0–1.2)
CALCIUM: 9 mg/dL (ref 8.7–10.2)
CO2: 24 mmol/L (ref 20–29)
Chloride: 105 mmol/L (ref 96–106)
Creatinine, Ser: 0.96 mg/dL (ref 0.57–1.00)
GFR calc non Af Amer: 65 mL/min/{1.73_m2} (ref >59)
GFR, EST AFRICAN AMERICAN: 75 mL/min/{1.73_m2} (ref >59)
Globulin, Total: 2.4 g/dL (ref 1.5–4.5)
Glucose: 83 mg/dL (ref 65–99)
POTASSIUM: 4.1 mmol/L (ref 3.5–5.2)
Sodium: 146 mmol/L — ABNORMAL HIGH (ref 134–144)
TOTAL PROTEIN: 6.6 g/dL (ref 6.0–8.5)

## 2018-09-21 LAB — TSH: TSH: 1.21 u[IU]/mL (ref 0.450–4.500)

## 2018-09-21 NOTE — Telephone Encounter (Signed)
I tried to call the patient multiple times today without answer. I left a message letting her know that her labs results were back. She may call the office with her results. Her labs results were normal with the exception of mildly elevated WBC count 12.5, absolute lymphocyte 4.9. She didn't mention any signs of feeling sick yesterday or having any recent viral illnesses. Her sodium was mildly high at 146.

## 2018-09-21 NOTE — Telephone Encounter (Signed)
lvm for pt to call back about scheduling mri  [employer] Auth: [place] spoke to [place] Ref # [id number]

## 2018-09-22 ENCOUNTER — Telehealth: Payer: Self-pay | Admitting: Neurology

## 2018-09-22 MED ORDER — ALPRAZOLAM 0.5 MG PO TABS: ORAL_TABLET | ORAL | 0 refills | Status: DC

## 2018-09-22 NOTE — Telephone Encounter (Signed)
Pt returned NP's call. NP advised ok to tell pt of the results. Pt said she was nauseated yesterday with no energy but did not have any symptoms on at OV.

## 2018-09-22 NOTE — Telephone Encounter (Signed)
A prescription has been sent to her pharmacy for xanax 0.5 mg Take 1-2 tablets thirty minutes prior to MRI.  May take one additional tablet before entering scanner, if needed.  MUST HAVE DRIVER.

## 2018-09-22 NOTE — Telephone Encounter (Signed)
Patient returned my call she is scheduled for 09/29/18 at [facility]. She is claustrophobic and will need something to help her.

## 2018-09-22 NOTE — Telephone Encounter (Signed)
Spoke with the patient and she verbalized the instructions about how to take the xanax for her MRI. No other questions or concerns at this time. She was very appreciative for the call.

## 2018-09-29 ENCOUNTER — Ambulatory Visit: Payer: BLUE CROSS/BLUE SHIELD

## 2018-09-29 DIAGNOSIS — H81399 Other peripheral vertigo, unspecified ear: Secondary | ICD-10-CM

## 2018-09-29 MED ORDER — GADOBENATE DIMEGLUMINE 529 MG/ML IV SOLN
5.0000 mL | Freq: Once | INTRAVENOUS | Status: AC | PRN
  Administered 2018-09-29: 5 mL via INTRAVENOUS

## 2018-10-01 NOTE — Telephone Encounter (Signed)
Unable to get in contact with the patient/ I left a detailed voicemail with her results(DPR verified). Office number provided in case she has any further questions.

## 2018-10-01 NOTE — Telephone Encounter (Signed)
Please call the patient and let her know that her MRI of the brain was unremarkable. Thank you.   IMPRESSION:   Unremarkable MRI brain (with and without). No acute findings.

## 2018-11-05 DIAGNOSIS — M0609 Rheumatoid arthritis without rheumatoid factor, multiple sites: Secondary | ICD-10-CM | POA: Diagnosis not present

## 2018-11-05 DIAGNOSIS — M797 Fibromyalgia: Secondary | ICD-10-CM | POA: Diagnosis not present

## 2018-11-05 DIAGNOSIS — M5136 Other intervertebral disc degeneration, lumbar region: Secondary | ICD-10-CM | POA: Diagnosis not present

## 2018-11-05 DIAGNOSIS — M064 Inflammatory polyarthropathy: Secondary | ICD-10-CM | POA: Diagnosis not present

## 2018-11-23 DIAGNOSIS — M791 Myalgia, unspecified site: Secondary | ICD-10-CM | POA: Diagnosis not present

## 2018-11-23 DIAGNOSIS — M5416 Radiculopathy, lumbar region: Secondary | ICD-10-CM | POA: Diagnosis not present

## 2018-11-23 DIAGNOSIS — M4726 Other spondylosis with radiculopathy, lumbar region: Secondary | ICD-10-CM | POA: Diagnosis not present

## 2018-11-23 DIAGNOSIS — G894 Chronic pain syndrome: Secondary | ICD-10-CM | POA: Diagnosis not present

## 2018-12-15 ENCOUNTER — Telehealth: Payer: Self-pay | Admitting: Neurology

## 2018-12-15 NOTE — Telephone Encounter (Signed)
I contacted the pt and left a vm for pt to return my call so we could complete the pre charting for 12/16/18 telephone visit.

## 2018-12-15 NOTE — Telephone Encounter (Signed)
Pt has called RN Megan back to respond to questions RN has.

## 2018-12-15 NOTE — Telephone Encounter (Signed)
Due to current COVID 19 pandemic, our office is severely reducing in office visits until further notice, in order to minimize the risk to our patients and healthcare providers.   Called patient and offered a virtual visit for her 5/21 appt. Patient declined as she does not have internet access and feels that a telephone visit would be best. I advised that she will receive 3 calls: one from RN to update chart, 1 from check in aprx. 30 minutes prior to appt, then finally doctor will call around appt time. Pt verbalized understanding.  Pt understands that although there may be some limitations with this type of visit, we will take all precautions to reduce any security or privacy concerns.  Pt understands that this will be treated like an in office visit and we will file with pt's insurance, and there may be a patient responsible charge related to this service.

## 2018-12-15 NOTE — Telephone Encounter (Signed)
I returned the pt's call and was able to update EMR for 12/16/18 telephone visit.   Pt' confirmed the best call back # is [phone]. Pt was advised to have her phone near her starting at 2 pm. Pt verbalized understanding.

## 2018-12-16 ENCOUNTER — Ambulatory Visit (INDEPENDENT_AMBULATORY_CARE_PROVIDER_SITE_OTHER): Payer: BLUE CROSS/BLUE SHIELD | Admitting: Neurology

## 2018-12-16 ENCOUNTER — Encounter: Payer: Self-pay | Admitting: Neurology

## 2018-12-16 ENCOUNTER — Other Ambulatory Visit: Payer: Self-pay

## 2018-12-16 DIAGNOSIS — G43119 Migraine with aura, intractable, without status migrainosus: Secondary | ICD-10-CM | POA: Diagnosis not present

## 2018-12-16 MED ORDER — VENLAFAXINE HCL ER 37.5 MG PO CP24: ORAL_CAPSULE | ORAL | 1 refills | Status: DC

## 2018-12-16 NOTE — Progress Notes (Signed)
     Virtual Visit via Telephone Note  I connected with April [name] on 12/16/18 at  2:30 PM EDT by telephone and verified that I am speaking with the correct person using two identifiers.  Location: Patient: The patient is at home. Provider: Physician in office.   I discussed the limitations, risks, security and privacy concerns of performing an evaluation and management service by telephone and the availability of in person appointments. I also discussed with the patient that there may be a patient responsible charge related to this service. The patient expressed understanding and agreed to proceed.   History of Present Illness: April [name] is a 58 year old right-handed white female with a history of intractable migraine headaches.  When last seen in January 2020, the patient has been doing fairly well with her headaches, she was only having one a month.  Within the last 4 to 6 weeks her headaches have significantly worsened, she is now having up to 20 headache days a month that are disabling to her.  The headaches are associated with photophobia and phonophobia, she may occasionally have some nausea and vomiting and dizziness.  She does not note any particular activators for her headache.  When she does get a headache she will take Tylenol Sinus without much benefit.  She is sleeping fairly well on some nights and other nights not so well.  She does drink tea but she is trying to convert more to juices during the day, reducing her caffeine intake.  In the past, she cannot tolerate Topamax although this medication works well for her.  She may not be able to tolerate Zonegran as she is allergic to sulfa drugs.  The patient did not take propranolol as she has asthma.  The patient is allergic to gabapentin, but she cannot tolerate Lyrica which she is on now.  She takes this for fibromyalgia.   Observations/Objective: On the telephone interview, the patient is alert and cooperative, speech is well  enunciated, not aphasic or dysarthric.  She is answering questions appropriately.  Assessment and Plan: 1.  Intractable migraine headache  2.  Fibromyalgia  The patient will be placed on Effexor working up to 75 mg daily.  If she is tolerating the drug but is still having frequent headaches, she will call us in several weeks to increase the dose.  If the Effexor is not helpful or she cannot tolerate it, we may go on to use 1 of the newer medication such as Aimovig.  She will follow-up here in 3 months.    Follow Up Instructions: 11-month follow-up, may see nurse practitioner.   I discussed the assessment and treatment plan with the patient. The patient was provided an opportunity to ask questions and all were answered. The patient agreed with the plan and demonstrated an understanding of the instructions.   The patient was advised to call back or seek an in-person evaluation if the symptoms worsen or if the condition fails to improve as anticipated.  I provided 22 minutes of non-face-to-face time during this encounter.   Kathrynn Ducking, MD

## 2018-12-20 ENCOUNTER — Emergency Department (HOSPITAL_COMMUNITY): Payer: BLUE CROSS/BLUE SHIELD

## 2018-12-20 ENCOUNTER — Emergency Department (HOSPITAL_COMMUNITY)
Admission: EM | Admit: 2018-12-20 | Discharge: 2018-12-20 | Disposition: A | Discharge status: Home / Self Care | Payer: BLUE CROSS/BLUE SHIELD | Attending: Emergency Medicine | Admitting: Emergency Medicine

## 2018-12-20 ENCOUNTER — Other Ambulatory Visit: Payer: Self-pay

## 2018-12-20 ENCOUNTER — Telehealth: Payer: Self-pay | Admitting: Neurology

## 2018-12-20 DIAGNOSIS — D320 Benign neoplasm of cerebral meninges: Secondary | ICD-10-CM | POA: Insufficient documentation

## 2018-12-20 DIAGNOSIS — R51 Headache: Secondary | ICD-10-CM | POA: Insufficient documentation

## 2018-12-20 DIAGNOSIS — G510 Bell's palsy: Secondary | ICD-10-CM | POA: Diagnosis not present

## 2018-12-20 DIAGNOSIS — M069 Rheumatoid arthritis, unspecified: Secondary | ICD-10-CM | POA: Insufficient documentation

## 2018-12-20 DIAGNOSIS — R29810 Facial weakness: Secondary | ICD-10-CM | POA: Diagnosis not present

## 2018-12-20 DIAGNOSIS — Z79899 Other long term (current) drug therapy: Secondary | ICD-10-CM | POA: Diagnosis not present

## 2018-12-20 DIAGNOSIS — Z87891 Personal history of nicotine dependence: Secondary | ICD-10-CM | POA: Insufficient documentation

## 2018-12-20 LAB — COMPREHENSIVE METABOLIC PANEL
ALT: 17 U/L (ref 0–44)
AST: 21 U/L (ref 15–41)
Albumin: 3.9 g/dL (ref 3.5–5.0)
Alkaline Phosphatase: 81 U/L (ref 38–126)
Anion gap: 11 (ref 5–15)
BUN: 13 mg/dL (ref 6–20)
CO2: 22 mmol/L (ref 22–32)
Calcium: 9.2 mg/dL (ref 8.9–10.3)
Chloride: 107 mmol/L (ref 98–111)
Creatinine, Ser: 1.02 mg/dL — ABNORMAL HIGH (ref 0.44–1.00)
GFR calc Af Amer: >60 mL/min (ref >60)
GFR calc non Af Amer: >60 mL/min (ref >60)
Glucose, Bld: 85 mg/dL (ref 70–99)
Potassium: 4 mmol/L (ref 3.5–5.1)
Sodium: 140 mmol/L (ref 135–145)
Total Bilirubin: 0.4 mg/dL (ref 0.3–1.2)
Total Protein: 7 g/dL (ref 6.5–8.1)

## 2018-12-20 LAB — CBC WITH DIFFERENTIAL/PLATELET
Abs Immature Granulocytes: 0.05 10*3/uL (ref 0.00–0.07)
Basophils Absolute: 0 10*3/uL (ref 0.0–0.1)
Basophils Relative: 0 %
Eosinophils Absolute: 0.1 10*3/uL (ref 0.0–0.5)
Eosinophils Relative: 1 %
HCT: 45.6 % (ref 36.0–46.0)
Hemoglobin: 15.3 g/dL — ABNORMAL HIGH (ref 12.0–15.0)
Immature Granulocytes: 1 %
Lymphocytes Relative: 19 %
Lymphs Abs: 2.1 10*3/uL (ref 0.7–4.0)
MCH: 32.7 pg (ref 26.0–34.0)
MCHC: 33.6 g/dL (ref 30.0–36.0)
MCV: 97.4 fL (ref 80.0–100.0)
Monocytes Absolute: 0.8 10*3/uL (ref 0.1–1.0)
Monocytes Relative: 8 %
Neutro Abs: 7.7 10*3/uL (ref 1.7–7.7)
Neutrophils Relative %: 71 %
Platelets: 272 10*3/uL (ref 150–400)
RBC: 4.68 MIL/uL (ref 3.87–5.11)
RDW: 13.9 % (ref 11.5–15.5)
WBC: 10.7 10*3/uL — ABNORMAL HIGH (ref 4.0–10.5)
nRBC: 0 % (ref 0.0–0.2)

## 2018-12-20 LAB — I-STAT CHEM 8, ED
BUN: 13 mg/dL (ref 6–20)
Calcium, Ion: 1.08 mmol/L — ABNORMAL LOW (ref 1.15–1.40)
Chloride: 108 mmol/L (ref 98–111)
Creatinine, Ser: 0.9 mg/dL (ref 0.44–1.00)
Glucose, Bld: 82 mg/dL (ref 70–99)
HCT: 45 % (ref 36.0–46.0)
Hemoglobin: 15.3 g/dL — ABNORMAL HIGH (ref 12.0–15.0)
Potassium: 3.9 mmol/L (ref 3.5–5.1)
Sodium: 140 mmol/L (ref 135–145)
TCO2: 22 mmol/L (ref 22–32)

## 2018-12-20 LAB — CBG MONITORING, ED: Glucose-Capillary: 85 mg/dL (ref 70–99)

## 2018-12-20 LAB — APTT: aPTT: 33 seconds (ref 24–36)

## 2018-12-20 LAB — I-STAT BETA HCG BLOOD, ED (MC, WL, AP ONLY): I-stat hCG, quantitative: <5 m[IU]/mL (ref <5)

## 2018-12-20 LAB — PROTIME-INR
INR: 1 (ref 0.8–1.2)
Prothrombin Time: 13.5 seconds (ref 11.4–15.2)

## 2018-12-20 MED ORDER — PREDNISONE 10 MG (21) PO TBPK: ORAL_TABLET | Freq: Every day | ORAL | 0 refills | Status: DC

## 2018-12-20 MED ORDER — HYPROMELLOSE (GONIOSCOPIC) 2.5 % OP SOLN: 1.0000 [drp] | Freq: Three times a day (TID) | OPHTHALMIC | 12 refills | Status: DC | PRN

## 2018-12-20 MED ORDER — ACETAMINOPHEN 325 MG PO TABS
650.0000 mg | ORAL_TABLET | Freq: Once | ORAL | Status: AC
  Administered 2018-12-20: 650 mg via ORAL
  Filled 2018-12-20: qty 2

## 2018-12-20 MED ORDER — DIPHENHYDRAMINE HCL 25 MG PO CAPS
25.0000 mg | ORAL_CAPSULE | Freq: Once | ORAL | Status: AC
  Administered 2018-12-20: 23:00:00 25 mg via ORAL
  Filled 2018-12-20: qty 1

## 2018-12-20 MED ORDER — GADOBUTROL 1 MMOL/ML IV SOLN
9.0000 mL | Freq: Once | INTRAVENOUS | Status: AC | PRN
  Administered 2018-12-20: 22:00:00 9 mL via INTRAVENOUS

## 2018-12-20 MED ORDER — SODIUM CHLORIDE 0.9% FLUSH: 3.0000 mL | Freq: Once | INTRAVENOUS | Status: DC

## 2018-12-20 MED ORDER — PROCHLORPERAZINE MALEATE 5 MG PO TABS
10.0000 mg | ORAL_TABLET | Freq: Once | ORAL | Status: AC
  Administered 2018-12-20: 10 mg via ORAL
  Filled 2018-12-20: qty 2

## 2018-12-20 MED ORDER — VALACYCLOVIR HCL 1 G PO TABS: 1000.0000 mg | ORAL_TABLET | Freq: Three times a day (TID) | ORAL | 0 refills | Status: AC

## 2018-12-20 NOTE — Telephone Encounter (Signed)
Patent has a terrible headache, the left side of her tongue feels numb and she has left facial droop. She has never had these symptoms with headaches or focal neurologic symptoms with headaches. Started Venlafaxine. Headache fore 3 days, facial droop started yesterday. I advised to go to the closest emergency room to be evaluated. Symptoms stable since yesterday, not worsening but still with droop, no other symptoms currently, no fever, no new limb weakness, no ataxia, no speech deficits but she is getting dizzy. Go to the emergency room.

## 2018-12-20 NOTE — ED Notes (Signed)
Patient transported to CT 

## 2018-12-20 NOTE — ED Notes (Signed)
Confirmed number with husband [phone]. Will wait in car until plan of care is decided.

## 2018-12-20 NOTE — ED Provider Notes (Signed)
[facility] EMERGENCY DEPARTMENT Provider Note   CSN: [id number] Arrival date & time: 12/20/18  1719    History   Chief Complaint Chief Complaint  Patient presents with   Facial Droop   Headache    HPI [place] is a 58 y.o. female.  With past medical history of meningioma, obesity who presents with headache for 3 days and left-sided facial droop and tongue numbness started yesterday at 4 PM ( 26hrs PTA).  She is also endorsing some left eye tearing. denies any fevers, rash, chest pain, shortness of breath, abdominal pain, nausea, vomiting.      HPI  Past Medical History:  Diagnosis Date   Arthritis    Brain tumor (benign) ([place])    Classic migraine with aura 01/27/2017   Fibromyalgia    Meningioma ([place]) 01/27/2017   Meralgia paresthetica of left side 01/27/2017   Obesity    Rheumatoid arthritis [facility])     Patient Active Problem List   Diagnosis Date Noted   Classic migraine with aura 01/27/2017   Meningioma ([place]) 01/27/2017   Meralgia paresthetica of left side 01/27/2017    Past Surgical History:  Procedure Laterality Date   APPENDECTOMY     PARTIAL HYSTERECTOMY       OB History   No obstetric history on file.      Home Medications    Prior to Admission medications   Medication Sig Start Date End Date Taking? Authorizing Provider  albuterol (PROAIR HFA) 108 (90 Base) MCG/ACT inhaler 2 puffs 4 (four) times daily.    [provider]  ALPRAZolam Duanne Moron) 0.5 MG tablet Take 1-2 tablets thirty minutes prior to MRI.  May take one additional tablet before entering scanner, if needed.  MUST HAVE DRIVER. 1/47/82   Suzzanne Cloud, NP  famotidine (PEPCID) 20 MG tablet Take 20 mg by mouth. Take two tablets daily.    [provider]  folic acid (FOLVITE) 1 MG tablet Take 1 mg by mouth daily. 11/04/16   [provider]  HYDROcodone-acetaminophen (NORCO) 5-325 MG tablet Taking 1 TID PRN    [provider]    hydroxychloroquine (PLAQUENIL) 200 MG tablet hydroxychloroquine 200 mg tablet  Take 2 tablets every day by oral route.    [provider]  hydroxypropyl methylcellulose / hypromellose (ISOPTO TEARS / GONIOVISC) 2.5 % ophthalmic solution Place 1 drop into the left eye 3 (three) times daily as needed for dry eyes. 12/20/18   Doneta Public, MD  ipratropium-albuterol (DUONEB) 0.5-2.5 (3) MG/3ML SOLN 4 (four) times daily.    [provider]  methotrexate (RHEUMATREX) 2.5 MG tablet Take 25 mg by mouth once a week. 11/11/16   [provider]  predniSONE (DELTASONE) 5 MG tablet Take 5 mg by mouth daily. 03/26/17   [provider]  predniSONE (STERAPRED UNI-PAK 21 TAB) 10 MG (21) TBPK tablet Take by mouth daily. Take 6 tabs by mouth daily  for 2 days, then 5 tabs for 2 days, then 4 tabs for 2 days, then 3 tabs for 2 days, 2 tabs for 2 days, then 1 tab by mouth daily for 2 days 12/20/18   Doneta Public, MD  pregabalin (LYRICA) 75 MG capsule Take 225 mg by mouth 2 (two) times daily.     [provider]  valACYclovir (VALTREX) 1000 MG tablet Take 1 tablet (1,000 mg total) by mouth 3 (three) times daily for 7 days. 12/20/18 12/27/18  Doneta Public, MD  venlafaxine XR (EFFEXOR XR) 37.5  MG 24 hr capsule 1 capsule daily for 1 week, then take 2 daily 12/16/18   Kathrynn Ducking, MD    Family History Family History  Problem Relation Age of Onset   COPD Mother    COPD Father    Migraines Father     Social History Social History   Tobacco Use   Smoking status: Former Smoker    Types: Cigarettes   Smokeless tobacco: Never Used   Tobacco comment: Quit 6 months  Substance Use Topics   Alcohol use: No   Drug use: No     Allergies   Aspirin; Cephalexin; Erythromycin base; Gabapentin; Sulfa antibiotics; and Topamax [topiramate]   Review of Systems Review of Systems  Constitutional: Negative for activity change, appetite change, chills, diaphoresis, fatigue  and fever.  HENT: Negative for ear pain and sore throat.        Tongue numbness  Eyes: Negative for photophobia, pain and visual disturbance.       Eye watering  Respiratory: Negative for cough and shortness of breath.   Cardiovascular: Negative for chest pain and palpitations.  Gastrointestinal: Negative for abdominal pain and vomiting.  Genitourinary: Negative for dysuria and hematuria.  Musculoskeletal: Negative for arthralgias and back pain.  Skin: Negative for color change and rash.  Neurological: Positive for numbness (tongue ) and headaches. Negative for seizures and syncope.  All other systems reviewed and are negative.    Physical Exam Updated Vital Signs BP 139/85    Pulse 74    Temp 98 F (36.7 C) (Oral)    Resp 16    Ht 5\' 1"  (1.549 m)    Wt 90.7 kg    SpO2 98%    BMI 37.79 kg/m   Physical Exam Vitals signs and nursing note reviewed.  Constitutional:      General: She is not in acute distress.    Appearance: She is well-developed. She is not ill-appearing.  HENT:     Head: Normocephalic and atraumatic.  Eyes:     General: No visual field deficit.    Conjunctiva/sclera: Conjunctivae normal.  Neck:     Musculoskeletal: Neck supple.  Cardiovascular:     Rate and Rhythm: Normal rate and regular rhythm.     Heart sounds: No murmur.  Pulmonary:     Effort: Pulmonary effort is normal. No respiratory distress.     Breath sounds: Normal breath sounds.  Abdominal:     Palpations: Abdomen is soft.     Tenderness: There is no abdominal tenderness.  Skin:    General: Skin is warm and dry.  Neurological:     Mental Status: She is alert and oriented to person, place, and time.     GCS: GCS eye subscore is 4. GCS verbal subscore is 5. GCS motor subscore is 6.     Cranial Nerves: Cranial nerve deficit and facial asymmetry (total left sided weakness and droop. Difficult to assess frontalis involvement as patient barely able to raise her eyebrows.) present. No dysarthria.      Sensory: No sensory deficit.      ED Treatments / Results  Labs (all labs ordered are listed, but only abnormal results are displayed) Labs Reviewed  COMPREHENSIVE METABOLIC PANEL - Abnormal; Notable for the following components:      Result Value   Creatinine, Ser 1.02 (*)    All other components within normal limits  CBC WITH DIFFERENTIAL/PLATELET - Abnormal; Notable for the following components:   WBC 10.7 (*)  Hemoglobin 15.3 (*)    All other components within normal limits  I-STAT CHEM 8, ED - Abnormal; Notable for the following components:   Calcium, Ion 1.08 (*)    Hemoglobin 15.3 (*)    All other components within normal limits  PROTIME-INR  APTT  CBC WITH DIFFERENTIAL/PLATELET  CBG MONITORING, ED  I-STAT BETA HCG BLOOD, ED ([facility], [facility], [facility] ONLY)    EKG EKG Interpretation  Date/Time:  Monday Dec 20 2018 18:05:12 EDT Ventricular Rate:  81 PR Interval:    QRS Duration: 102 QT Interval:  395 QTC Calculation: 459 R Axis:   80 Text Interpretation:  Sinus rhythm Low voltage, precordial leads no acute ST/T changes No old tracing to compare Confirmed by Sherwood Gambler [phone]) on 12/20/2018 7:59:35 PM   Radiology Ct Head Wo Contrast  Result Date: 12/20/2018 CLINICAL DATA:  Severe headache. Left facial droop. Rule out stroke. EXAM: CT HEAD WITHOUT CONTRAST TECHNIQUE: Contiguous axial images were obtained from the base of the skull through the vertex without intravenous contrast. COMPARISON:  MRI head 11/27/2016 FINDINGS: Brain: Ventricle size normal. Negative for acute infarct or hemorrhage. Calcified dural based lesion in the left frontal convexity compatible with meningioma. This measures approximately 10 mm and is similar to the prior study. No brain edema. Vascular: Negative for hyperdense vessel Skull: Negative Sinuses/Orbits: Mild mucosal edema paranasal sinuses.  Normal orbit. Other: None IMPRESSION: No acute abnormality 1 cm calcified meningioma left frontal convexity  unchanged. Electronically Signed   By: Franchot Gallo M.D.   On: 12/20/2018 18:46   Mr Jeri Cos And Wo Contrast  Result Date: 12/20/2018 CLINICAL DATA:  Initial evaluation for acute left-sided facial weakness and droop. EXAM: MRI HEAD WITHOUT AND WITH CONTRAST TECHNIQUE: Multiplanar, multiecho pulse sequences of the brain and surrounding structures were obtained without and with intravenous contrast. CONTRAST:  9 cc of Gadavist. COMPARISON:  Prior CT from earlier the same day as well as previous MRI from 11/27/2016. FINDINGS: Brain: Cerebral volume within normal limits for age. Minimal T2/FLAIR hyperintensity within the periventricular deep white matter both cerebral hemispheres, nonspecific, but felt to be within normal limits for age. No abnormal foci of restricted diffusion to suggest acute or subacute ischemia. Gray-white matter differentiation maintained. No encephalomalacia to suggest chronic cortical infarction. No evidence for acute or chronic intracranial hemorrhage. 14 mm meningioma overlying the posterior left frontoparietal convexity again seen, stable from previous. No significant mass effect or edema. No other mass lesion or abnormal enhancement. No mass effect or midline shift. No hydrocephalus. No extra-axial fluid collection. No made of a partially empty sella. Midline structures intact and normal. Vascular: Major intracranial vascular flow voids are maintained. Skull and upper cervical spine: Craniocervical junction within normal limits. Bone marrow signal intensity normal. No scalp soft tissue abnormality. Sinuses/Orbits: Globes and orbital soft tissues within normal limits. Mild scattered mucosal thickening throughout the paranasal sinuses. No air-fluid level to suggest acute sinusitis. Mastoid air cells are clear. Inner ear structures grossly normal. Other: None. IMPRESSION: 1. Negative brain MRI, with no acute intracranial infarct or other abnormality identified. 2. 14 mm meningioma  overlying the left frontoparietal convexity without significant mass effect, stable. Electronically Signed   By: Jeannine Boga M.D.   On: 12/20/2018 22:37    Procedures Procedures (including critical care time)  Medications Ordered in ED Medications  sodium chloride flush (NS) 0.9 % injection 3 mL (3 mLs Intravenous Not Given 12/20/18 1827)  acetaminophen (TYLENOL) tablet 650 mg (650 mg Oral Given 12/20/18  1826)  gadobutrol (GADAVIST) 1 MMOL/ML injection 9 mL (9 mLs Intravenous Contrast Given 12/20/18 2153)  diphenhydrAMINE (BENADRYL) capsule 25 mg (25 mg Oral Given 12/20/18 2313)  prochlorperazine (COMPAZINE) tablet 10 mg (10 mg Oral Given 12/20/18 2313)     Initial Impression / Assessment and Plan / ED Course  I have reviewed the triage vital signs and the nursing notes.  Pertinent labs & imaging results that were available during my care of the patient were reviewed by me and considered in my medical decision making (see chart for details).        April [name] is a 58 y.o. female.  With past medical history of meningioma, obesity who presents with headache for 3 days and left-sided facial droop and tongue numbness started yesterday at 4 PM ( 26hrs PTA).   On initial exam patient well appearing, not in acute distress. VSS. Physical exam as above left-sided facial droop with weakness in the cranial nerve VII distribution.  Patient likely suffering from Bell's palsy however given her history of meningioma concern for possible stroke or increase in her known meningioma with resulting neurological defici.t CT head ordered that was negative.  Will obtain a MRI given her history.   MRI with and without brain negative for acute stroke.  Her meningioma is stable in size without any mass-effect.  Given negative imaging and neuro exam patient likely suffering from Bell's palsy.  Lengthy discussion had with patient on Bell's palsy.  No rash noted.  We will treat patient with  valacyclovir, steroid taper, lubricating eyedrops. Patient will follow up with Dr. Jannifer Franklin her neurologist.  Patient discharged home with handout for Bell's palsy. Return precautions given. All questions answered.  Final Clinical Impressions(s) / ED Diagnoses   Final diagnoses:  Bell's palsy    ED Discharge Orders         Ordered    predniSONE (STERAPRED UNI-PAK 21 TAB) 10 MG (21) TBPK tablet  Daily     12/20/18 2321    valACYclovir (VALTREX) 1000 MG tablet  3 times daily     12/20/18 2321    hydroxypropyl methylcellulose / hypromellose (ISOPTO TEARS / GONIOVISC) 2.5 % ophthalmic solution  3 times daily PRN     12/20/18 2321           Doneta Public, MD 12/20/18 2350    Sherwood Gambler, MD 12/22/18 1310

## 2018-12-20 NOTE — ED Triage Notes (Signed)
Onset 3 days developed a headache continued today and one day ago at 1600 developed left facial droop and tongue numbness. Alert answering and following command appropriate. VAN negative.

## 2018-12-21 NOTE — Telephone Encounter (Signed)
This patient was seen in emergency room, felt to have Bell's palsy, we will see her in the office in 6 to 8 weeks.

## 2018-12-22 ENCOUNTER — Ambulatory Visit: Payer: BLUE CROSS/BLUE SHIELD | Admitting: Neurology

## 2018-12-22 ENCOUNTER — Telehealth: Payer: Self-pay

## 2018-12-22 NOTE — Telephone Encounter (Signed)
-----   Message from Kathrynn Ducking, MD sent at 12/21/2018 10:14 AM EDT ----- Need RV with this patient in 6 to 8 weeks, thanks.

## 2018-12-22 NOTE — Telephone Encounter (Signed)
Requested a call back from the pt to schedule 6-8 week f/u.

## 2019-02-02 ENCOUNTER — Ambulatory Visit: Payer: BC Managed Care – PPO | Admitting: Neurology

## 2019-02-02 ENCOUNTER — Other Ambulatory Visit: Payer: Self-pay

## 2019-02-02 ENCOUNTER — Encounter: Payer: Self-pay | Admitting: Neurology

## 2019-02-02 VITALS — BP 112/64 | HR 83 | Temp 96.8°F | Ht 61.75 in | Wt 199.0 lb

## 2019-02-02 DIAGNOSIS — G43119 Migraine with aura, intractable, without status migrainosus: Secondary | ICD-10-CM | POA: Diagnosis not present

## 2019-02-02 MED ORDER — VENLAFAXINE HCL ER 150 MG PO CP24: 150.0000 mg | ORAL_CAPSULE | Freq: Every day | ORAL | 3 refills | Status: DC

## 2019-02-02 MED ORDER — AIMOVIG 140 MG/ML ~~LOC~~ SOAJ: 140.0000 mg | SUBCUTANEOUS | 4 refills | Status: DC

## 2019-02-02 NOTE — Progress Notes (Signed)
Reason for visit: Migraine headache  April [name] is an 58 y.o. female  History of present illness:  April [name] is a 58 year old right-handed white female with a history of migraine headache.  The patient has had more frequent headaches recently, she is now having 2-3 headaches a week associated with significant photophobia and phonophobia and some nausea and vomiting.  The patient denies any neck pain with her headaches but the headaches are oftentimes in the occipital area of the head.  She also has some back pain and left leg discomfort and is been seen through [facility] for this and was told that she had a nerve issue.  She has pain with weightbearing on the left leg primarily in the pretibial area on the left.  The patient uses a cane for ambulation usually.  She returns for an evaluation.  The patient was seen in the emergency room on 20 Dec 2018 with left-sided facial weakness associated with Bell's palsy.  MRI of the brain was done was unremarkable at that time.  She has improved dramatically with the left-sided Bell's palsy.  Past Medical History:  Diagnosis Date  . Arthritis   . Brain tumor (benign) ([place])   . Classic migraine with aura 01/27/2017  . Fibromyalgia   . Meningioma ([place]) 01/27/2017  . Meralgia paresthetica of left side 01/27/2017  . Obesity   . Rheumatoid arthritis [facility])     Past Surgical History:  Procedure Laterality Date  . APPENDECTOMY    . PARTIAL HYSTERECTOMY      Family History  Problem Relation Age of Onset  . COPD Mother   . COPD Father   . Migraines Father     Social history:  reports that she has quit smoking. Her smoking use included cigarettes. She has never used smokeless tobacco. She reports that she does not drink alcohol or use drugs.    Allergies  Allergen Reactions  . Aspirin Hives  . Cephalexin Hives  . Erythromycin Base Hives  . Gabapentin     Memory difficulty  . Sulfa Antibiotics Hives  . Topamax [Topiramate]     Diarrhea     Medications:  Prior to Admission medications   Medication Sig Start Date End Date Taking? Authorizing Provider  albuterol (PROAIR HFA) 108 (90 Base) MCG/ACT inhaler 2 puffs 4 (four) times daily.    [provider]  famotidine (PEPCID) 20 MG tablet Take 20 mg by mouth. Take two tablets daily.    [provider]  folic acid (FOLVITE) 1 MG tablet Take 1 mg by mouth daily. 11/04/16   [provider]  HYDROcodone-acetaminophen (NORCO) 5-325 MG tablet Taking 1 TID PRN    [provider]  hydroxychloroquine (PLAQUENIL) 200 MG tablet hydroxychloroquine 200 mg tablet  Take 2 tablets every day by oral route.    [provider]  ipratropium-albuterol (DUONEB) 0.5-2.5 (3) MG/3ML SOLN 4 (four) times daily.    [provider]  methotrexate (RHEUMATREX) 2.5 MG tablet Take 25 mg by mouth once a week. 11/11/16   [provider]  pregabalin (LYRICA) 75 MG capsule Take 225 mg by mouth 2 (two) times daily.     [provider]  venlafaxine XR (EFFEXOR XR) 37.5 MG 24 hr capsule 1 capsule daily for 1 week, then take 2 daily 12/16/18   Kathrynn Ducking, MD    ROS:  Out of a complete 14 system review of symptoms, the patient complains only of the following symptoms, and all other  reviewed systems are negative.  Headache Left leg pain  Blood pressure 112/64, pulse 83, temperature (!) 96.8 F (36 C), temperature source Temporal, height 5' 1.75" (1.568 m), weight 199 lb (90.3 kg), SpO2 95 %.  Physical Exam  General: The patient is alert and cooperative at the time of the examination.  The patient is moderately obese.  Skin: No significant peripheral edema is noted.   Neurologic Exam  Mental status: The patient is alert and oriented x 3 at the time of the examination. The patient has apparent normal recent and remote memory, with an apparently normal attention span and concentration ability.   Cranial nerves: Facial symmetry is  present. Speech is normal, no aphasia or dysarthria is noted. Extraocular movements are full. Visual fields are full.  Motor: The patient has good strength in all 4 extremities.  Sensory examination: Soft touch sensation is symmetric on the face, arms, and legs.  Coordination: The patient has good finger-nose-finger and heel-to-shin bilaterally.  Gait and station: The patient has a limping type gait on the left leg.  She is able to walk independently.  Tandem gait is unsteady.  Romberg is negative.  Reflexes: Deep tendon reflexes are symmetric.   Assessment/Plan:  1.  History of migraine headache, intractable  2.  Left Bell's palsy, resolved  3.  Chronic left leg pain  The patient will be increased on Effexor to 150 mg daily.  She will be given a trial on Aimovig.  In the past, the patient could not tolerate Zonegran, Topamax, or gabapentin.  She is on Lyrica, she is on Effexor.  She cannot take beta-blockers secondary to asthma.  The patient will follow-up here in 4 months.  Her headaches remain intractable.  Jill Alexanders MD 02/02/2019 9:49 AM  [facility] [address] [place],  [address]  Phone [phone] Fax [phone]

## 2019-02-02 NOTE — Patient Instructions (Signed)
We will increase the Effexor to 150 mg a day and start Aimovig for the headache.

## 2019-04-08 DIAGNOSIS — M4726 Other spondylosis with radiculopathy, lumbar region: Secondary | ICD-10-CM | POA: Diagnosis not present

## 2019-04-08 DIAGNOSIS — M791 Myalgia, unspecified site: Secondary | ICD-10-CM | POA: Diagnosis not present

## 2019-04-08 DIAGNOSIS — M5136 Other intervertebral disc degeneration, lumbar region: Secondary | ICD-10-CM | POA: Diagnosis not present

## 2019-04-08 DIAGNOSIS — Z79899 Other long term (current) drug therapy: Secondary | ICD-10-CM | POA: Diagnosis not present

## 2019-04-08 DIAGNOSIS — M47812 Spondylosis without myelopathy or radiculopathy, cervical region: Secondary | ICD-10-CM | POA: Diagnosis not present

## 2019-04-08 DIAGNOSIS — M5416 Radiculopathy, lumbar region: Secondary | ICD-10-CM | POA: Diagnosis not present

## 2019-04-22 DIAGNOSIS — G5602 Carpal tunnel syndrome, left upper limb: Secondary | ICD-10-CM | POA: Diagnosis not present

## 2019-06-05 NOTE — Progress Notes (Signed)
PATIENT: April [name] DOB: 10-26-1960  REASON FOR VISIT: follow up HISTORY FROM: patient  HISTORY OF PRESENT ILLNESS: Today 06/06/19  [name] is a 58 year old female with history of migraine headaches.  She was diagnosed with a left-sided Bell's palsy in May 2020.  In the past for the headache she could not tolerate Zonegran, Topamax, or gabapentin.  At her last office visit Effexor was increased to 150 mg daily, and she was started on Aimovig.  She remains on Lyrica.  She did 2 injections of Aimovig, says she developed hives, did not continue the medication or notice a benefit.  She continues to report 1 migraine headache per week lasting 3 to 4 days.  She does report nausea with her headaches.  She indicates she did not develop migraine headaches until 3 or 4 years ago.  She tried Imitrex in the past, but said it made her feel sick.  She has essentially recovered from a left-sided Bell's palsy in May 2020, she has some residual altered sensation around her left mouth.  She did find the higher dose of Effexor to be beneficial for her migraine but she ran out of the medication a month or so ago.  She does walk with a cane, due to issues with her left leg.  She presents today for follow-up unaccompanied.  HISTORY 02/02/2019 Dr. Jannifer Franklin: [name] is a 58 year old right-handed white female with a history of migraine headache.  The patient has had more frequent headaches recently, she is now having 2-3 headaches a week associated with significant photophobia and phonophobia and some nausea and vomiting.  The patient denies any neck pain with her headaches but the headaches are oftentimes in the occipital area of the head.  She also has some back pain and left leg discomfort and is been seen through [facility] for this and was told that she had a nerve issue.  She has pain with weightbearing on the left leg primarily in the pretibial area on the left.  The patient uses a cane for ambulation usually.   She returns for an evaluation.  The patient was seen in the emergency room on 20 Dec 2018 with left-sided facial weakness associated with Bell's palsy.  MRI of the brain was done was unremarkable at that time.  She has improved dramatically with the left-sided Bell's palsy.   REVIEW OF SYSTEMS: Out of a complete 14 system review of symptoms, the patient complains only of the following symptoms, and all other reviewed systems are negative.  Dizziness, headache  ALLERGIES: Allergies  Allergen Reactions  . Aspirin Hives  . Cephalexin Hives  . Erythromycin Base Hives  . Gabapentin     Memory difficulty  . Sulfa Antibiotics Hives  . Topamax [Topiramate]     Diarrhea    HOME MEDICATIONS: Outpatient Medications Prior to Visit  Medication Sig Dispense Refill  . albuterol (PROAIR HFA) 108 (90 Base) MCG/ACT inhaler 2 puffs 4 (four) times daily.    . famotidine (PEPCID) 20 MG tablet Take 20 mg by mouth. Take two tablets daily.    . folic acid (FOLVITE) 1 MG tablet Take 1 mg by mouth daily.    Marland Kitchen HYDROcodone-acetaminophen (NORCO) 5-325 MG tablet Taking 1 TID PRN    . hydroxychloroquine (PLAQUENIL) 200 MG tablet hydroxychloroquine 200 mg tablet  Take 2 tablets every day by oral route.    Marland Kitchen ipratropium-albuterol (DUONEB) 0.5-2.5 (3) MG/3ML SOLN 4 (four) times daily.    . methotrexate (RHEUMATREX) 2.5 MG  tablet Take 25 mg by mouth once a week.    . pregabalin (LYRICA) 225 MG capsule Take 225 mg by mouth 2 (two) times daily.    Eduard Roux (AIMOVIG) 140 MG/ML SOAJ Inject 140 mg into the skin every 30 (thirty) days. 1 pen 4  . venlafaxine XR (EFFEXOR XR) 150 MG 24 hr capsule Take 1 capsule (150 mg total) by mouth daily with breakfast. 30 capsule 3  . pregabalin (LYRICA) 75 MG capsule Take 225 mg by mouth 2 (two) times daily.      No facility-administered medications prior to visit.     PAST MEDICAL HISTORY: Past Medical History:  Diagnosis Date  . Arthritis   . Brain tumor (benign) ([place])    . Classic migraine with aura 01/27/2017  . Fibromyalgia   . Meningioma ([place]) 01/27/2017  . Meralgia paresthetica of left side 01/27/2017  . Obesity   . Rheumatoid arthritis ([place])     PAST SURGICAL HISTORY: Past Surgical History:  Procedure Laterality Date  . APPENDECTOMY    . PARTIAL HYSTERECTOMY      FAMILY HISTORY: Family History  Problem Relation Age of Onset  . COPD Mother   . COPD Father   . Migraines Father     SOCIAL HISTORY: Social History   Socioeconomic History  . Marital status: Married    Spouse name: Not on file  . Number of children: 3  . Years of education: 7th grade  . Highest education level: Not on file  Occupational History  . Occupation: Agricultural engineer  Social Needs  . Financial resource strain: Not on file  . Food insecurity    Worry: Not on file    Inability: Not on file  . Transportation needs    Medical: Not on file    Non-medical: Not on file  Tobacco Use  . Smoking status: Former Smoker    Types: Cigarettes  . Smokeless tobacco: Never Used  . Tobacco comment: Quit 6 months  Substance and Sexual Activity  . Alcohol use: No  . Drug use: No  . Sexual activity: Not on file  Lifestyle  . Physical activity    Days per week: Not on file    Minutes per session: Not on file  . Stress: Not on file  Relationships  . Social Herbalist on phone: Not on file    Gets together: Not on file    Attends religious service: Not on file    Active member of club or organization: Not on file    Attends meetings of clubs or organizations: Not on file    Relationship status: Not on file  . Intimate partner violence    Fear of current or ex partner: Not on file    Emotionally abused: Not on file    Physically abused: Not on file    Forced sexual activity: Not on file  Other Topics Concern  . Not on file  Social History Narrative   Lives at home with husband.   Right-handed.   4 glasses of tea per day.    PHYSICAL EXAM  Vitals:    06/06/19 0925  BP: 124/78  Pulse: 78  Temp: (!) 97.3 F (36.3 C)  TempSrc: Oral  Weight: 192 lb 6.4 oz (87.3 kg)  Height: 5' 1.75" (1.568 m)   Body mass index is 35.48 kg/m.  Generalized: Well developed, in no acute distress   Neurological examination  Mentation: Alert oriented to time, place, history taking. Follows all  commands speech and language fluent Cranial nerve II-XII: Pupils were equal round reactive to light. Extraocular movements were full, visual field were full on confrontational test. Facial sensation and strength were normal. Head turning and shoulder shrug  were normal and symmetric. Motor: The motor testing reveals 5 over 5 strength of all 4 extremities. Good symmetric motor tone is noted throughout.  Sensory: Sensory testing is intact to soft touch on all 4 extremities. No evidence of extinction is noted.  Coordination: Cerebellar testing reveals good finger-nose-finger and heel-to-shin bilaterally.  Gait and station: She has a limping type gait on the left, she walks with a cane. Reflexes: Deep tendon reflexes are symmetric   DIAGNOSTIC DATA (LABS, IMAGING, TESTING) - I reviewed patient records, labs, notes, testing and imaging myself where available.  Lab Results  Component Value Date   WBC 10.7 (H) 12/20/2018   HGB 15.3 (H) 12/20/2018   HCT 45.6 12/20/2018   MCV 97.4 12/20/2018   PLT 272 12/20/2018      Component Value Date/Time   NA 140 12/20/2018 1827   NA 146 (H) 09/20/2018 1705   K 3.9 12/20/2018 1827   CL 108 12/20/2018 1827   CO2 22 12/20/2018 1815   GLUCOSE 82 12/20/2018 1827   BUN 13 12/20/2018 1827   BUN 13 09/20/2018 1705   CREATININE 0.90 12/20/2018 1827   CALCIUM 9.2 12/20/2018 1815   PROT 7.0 12/20/2018 1815   PROT 6.6 09/20/2018 1705   ALBUMIN 3.9 12/20/2018 1815   ALBUMIN 4.2 09/20/2018 1705   AST 21 12/20/2018 1815   ALT 17 12/20/2018 1815   ALKPHOS 81 12/20/2018 1815   BILITOT 0.4 12/20/2018 1815   BILITOT 0.3 09/20/2018  1705   GFRNONAA >60 12/20/2018 1815   GFRAA >60 12/20/2018 1815   No results found for: CHOL, HDL, LDLCALC, LDLDIRECT, TRIG, CHOLHDL No results found for: HGBA1C No results found for: VITAMINB12 Lab Results  Component Value Date   TSH 1.210 09/20/2018      ASSESSMENT AND PLAN 58 y.o. year old female  has a past medical history of Arthritis, Brain tumor (benign) ([place]), Classic migraine with aura (01/27/2017), Fibromyalgia, Meningioma ([place]) (01/27/2017), Meralgia paresthetica of left side (01/27/2017), Obesity, and Rheumatoid arthritis ([place]). here with:  1.  Chronic migraine headache 2.  Left-sided Bell's palsy, resolved May 2020 3.  Chronic left leg pain  She continues to complain of chronic migraine headaches, on average 1/week, lasting 3-4 days.  She tried Aimovig for 2 injections, said it was not beneficial, and she developed hives.  We will discontinue Aimovig, she does not wish to try another injectable medication, but we could potentially try Ajovy or Emgality.  She is not interested in Botox injections.  She has found Effexor to be beneficial, but ran out of the medication.  I will refill Effexor 150 mg daily.  In the past she tried Imitrex, but had side effect.  She may try Maxalt for acute relief, she denies history of stroke or cardiac disease.  I will send in a prescription for Zofran for nausea with headache.  In the past, she could not tolerate Zonegran, Topamax, or gabapentin.  She remains on Lyrica.  She cannot take beta-blockers secondary to asthma.  MRI of the brain was performed in May 2020, showed a stable 14 mm meningioma.  She will follow-up in 6 months or sooner if needed.  I did advise if her symptoms worsen or she develops any new symptoms she should let us know.  I spent 15 minutes with the patient. 50% of this time was spent discussing her plan of care.  Butler Denmark, AGNP-C, DNP 06/06/2019, 10:22 AM [facility] [address], [place], [place]  [address] [phone]

## 2019-06-06 ENCOUNTER — Encounter: Payer: Self-pay | Admitting: Neurology

## 2019-06-06 ENCOUNTER — Other Ambulatory Visit: Payer: Self-pay

## 2019-06-06 ENCOUNTER — Ambulatory Visit: Payer: BC Managed Care – PPO | Admitting: Neurology

## 2019-06-06 VITALS — BP 124/78 | HR 78 | Temp 97.3°F | Ht 61.75 in | Wt 192.4 lb

## 2019-06-06 DIAGNOSIS — G43119 Migraine with aura, intractable, without status migrainosus: Secondary | ICD-10-CM

## 2019-06-06 MED ORDER — ONDANSETRON HCL 4 MG PO TABS: 4.0000 mg | ORAL_TABLET | Freq: Three times a day (TID) | ORAL | 0 refills | Status: AC | PRN

## 2019-06-06 MED ORDER — VENLAFAXINE HCL ER 150 MG PO CP24: 150.0000 mg | ORAL_CAPSULE | Freq: Every day | ORAL | 6 refills | Status: AC

## 2019-06-06 MED ORDER — RIZATRIPTAN BENZOATE 10 MG PO TABS: 10.0000 mg | ORAL_TABLET | ORAL | 3 refills | Status: AC | PRN

## 2019-06-06 NOTE — Progress Notes (Signed)
I have read the note, and I agree with the clinical assessment and plan.  Broedy Osbourne K Aine Strycharz   

## 2019-06-06 NOTE — Patient Instructions (Addendum)
We will stop the Aimovig. Restart Effexor, take Maxalt as needed for headache, zofran for nausea. Return in 6 months.

## 2019-07-04 DIAGNOSIS — M797 Fibromyalgia: Secondary | ICD-10-CM | POA: Diagnosis not present

## 2019-07-04 DIAGNOSIS — M0609 Rheumatoid arthritis without rheumatoid factor, multiple sites: Secondary | ICD-10-CM | POA: Diagnosis not present

## 2019-07-04 DIAGNOSIS — M4726 Other spondylosis with radiculopathy, lumbar region: Secondary | ICD-10-CM | POA: Diagnosis not present

## 2019-07-04 DIAGNOSIS — G894 Chronic pain syndrome: Secondary | ICD-10-CM | POA: Diagnosis not present

## 2019-07-04 DIAGNOSIS — M5136 Other intervertebral disc degeneration, lumbar region: Secondary | ICD-10-CM | POA: Diagnosis not present

## 2019-07-04 DIAGNOSIS — M791 Myalgia, unspecified site: Secondary | ICD-10-CM | POA: Diagnosis not present

## 2019-07-04 DIAGNOSIS — M5416 Radiculopathy, lumbar region: Secondary | ICD-10-CM | POA: Diagnosis not present

## 2019-07-04 DIAGNOSIS — M064 Inflammatory polyarthropathy: Secondary | ICD-10-CM | POA: Diagnosis not present

## 2019-07-04 DIAGNOSIS — Z79891 Long term (current) use of opiate analgesic: Secondary | ICD-10-CM | POA: Diagnosis not present

## 2019-07-04 DIAGNOSIS — Z79899 Other long term (current) drug therapy: Secondary | ICD-10-CM | POA: Diagnosis not present

## 2019-12-06 ENCOUNTER — Ambulatory Visit: Payer: BC Managed Care – PPO | Admitting: Neurology

## 2019-12-21 ENCOUNTER — Telehealth: Payer: Self-pay | Admitting: *Deleted

## 2019-12-21 NOTE — Telephone Encounter (Signed)
LVM informing patient her FU on 12/27/19 needs to be rescheduled due to NP being out of the office. I left # for her to call back and reschedule.

## 2019-12-27 ENCOUNTER — Ambulatory Visit: Payer: BC Managed Care – PPO | Admitting: Neurology

## 2020-10-11 IMAGING — CT CT HEAD WITHOUT CONTRAST
3 of 4 series · 15 of 47 positions shown, 18 images · non-contrast
Comparison: MRI head 11/27/2016

CLINICAL DATA: Severe headache. Left facial droop. Rule out stroke.

EXAM:
CT HEAD WITHOUT CONTRAST
TECHNIQUE: Contiguous axial images were obtained from the base of the skull
through the vertex without intravenous contrast.

[Series 4: head 2.0 h70h · axial · 0.45mm/px · z∈[-91,+31]mm · 9 of 77 slices shown, 12 images]
[im 8/77  brain]
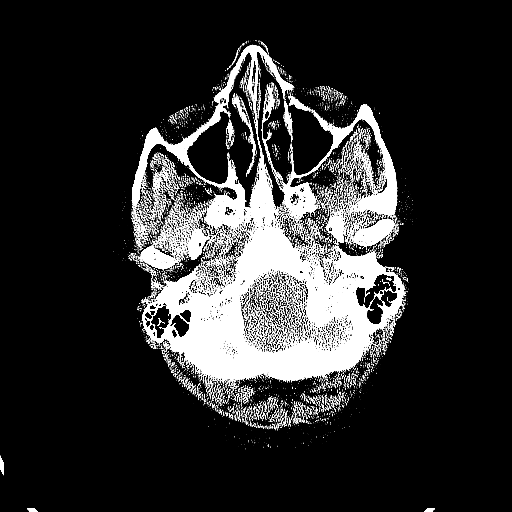
[im 8/77  bone]
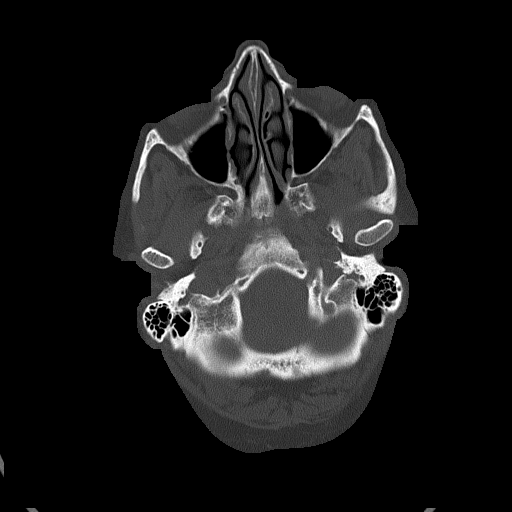
[im 16/77  brain]
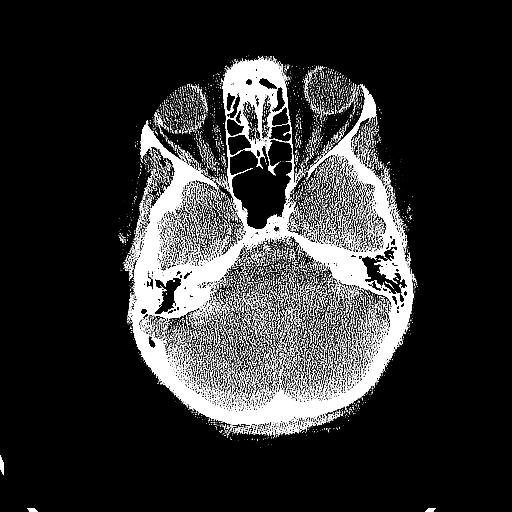
[im 23/77  brain]
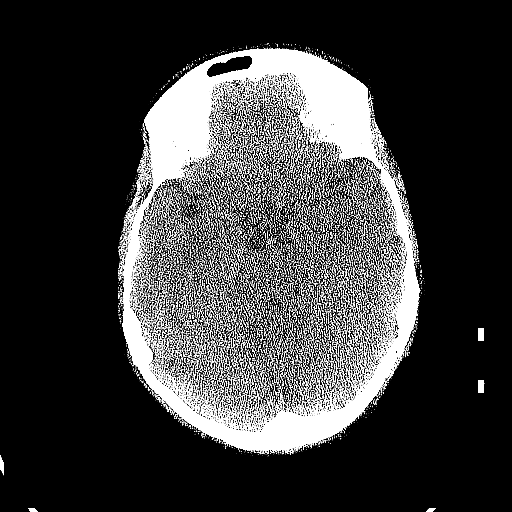
[im 31/77  brain]
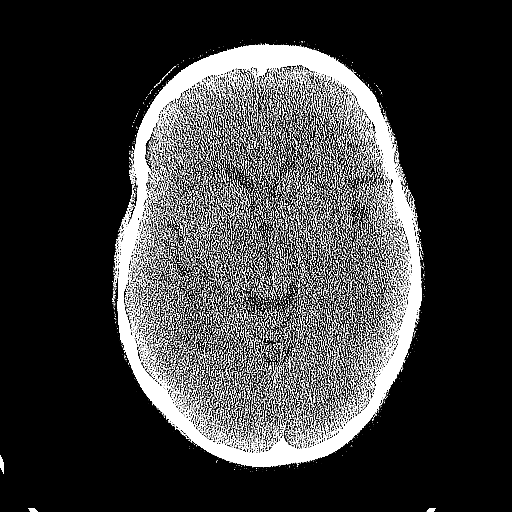
[im 39/77  brain]
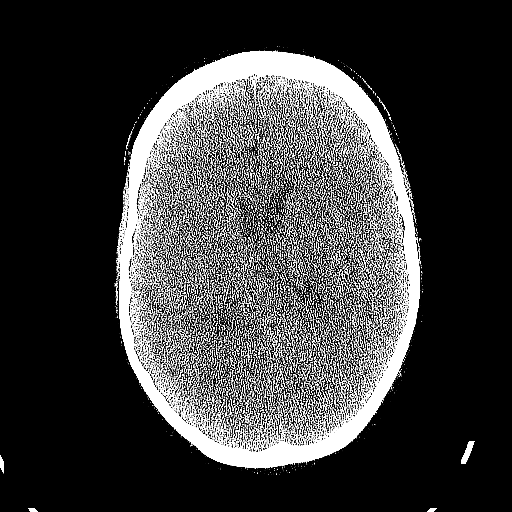
[im 39/77  bone]
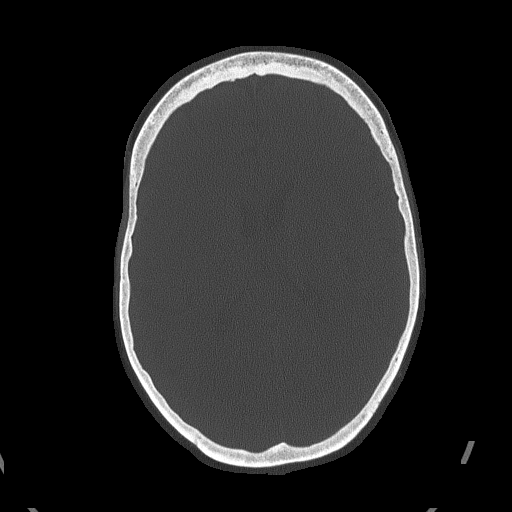
[im 46/77  brain]
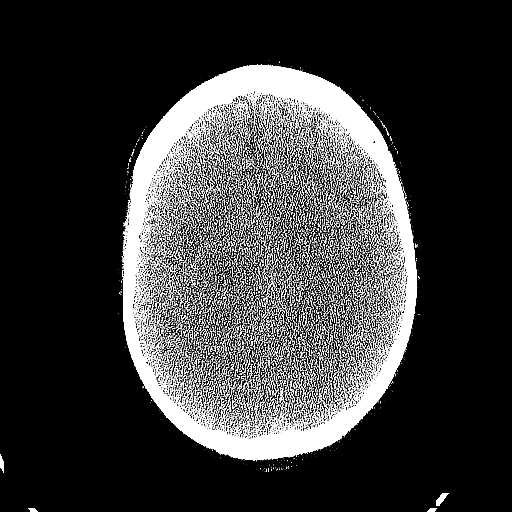
[im 54/77  brain]
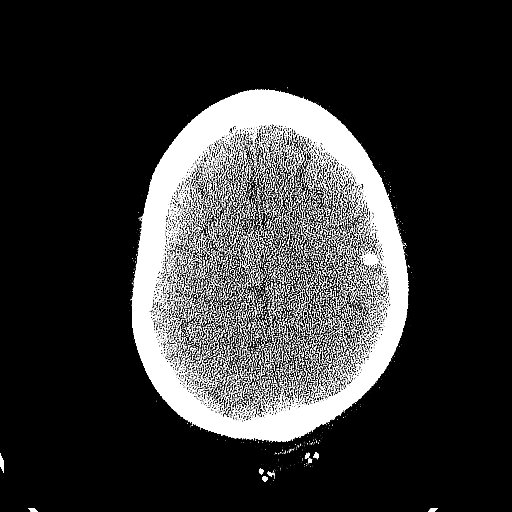
[im 61/77  brain]
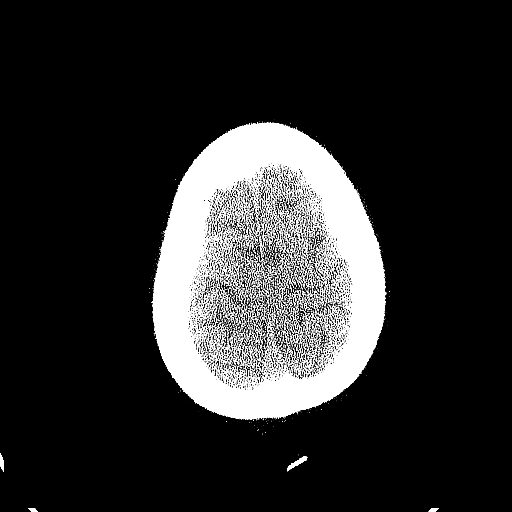
[im 69/77  brain]
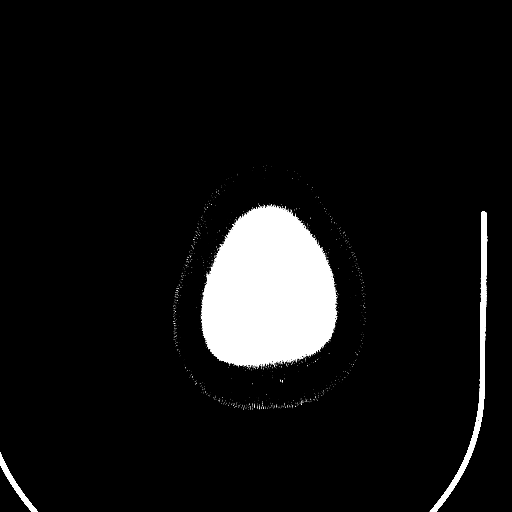
[im 69/77  bone]
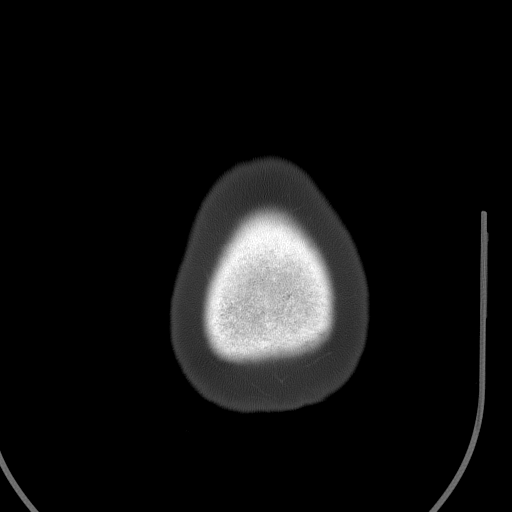

[Series 5: head 3.0 mpr cor · coronal · 0.30mm/px · 3 of 70 slices shown]
[im 24/70  brain]
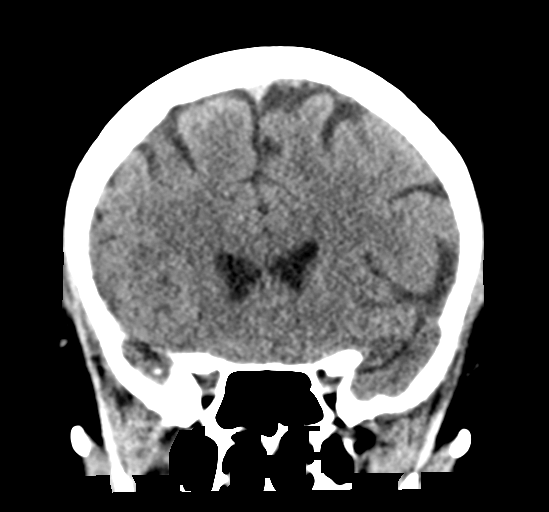
[im 31/70  brain]
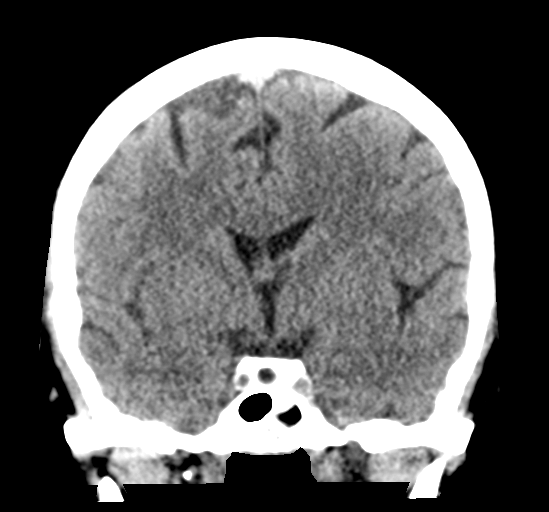
[im 39/70  brain]
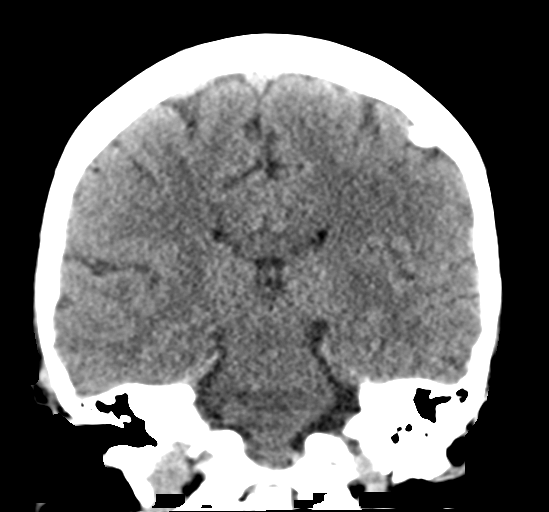

[Series 6: head 3.0 mpr sag · sagittal · 0.31mm/px · 3 of 57 slices shown]
[im 19/57  brain]
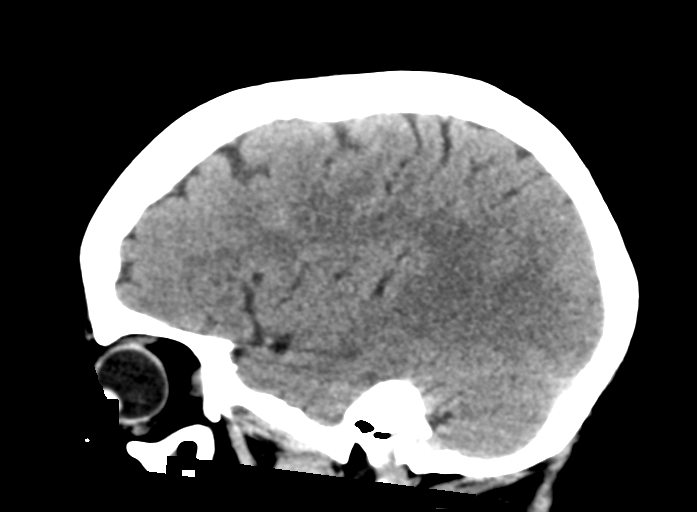
[im 29/57  brain]
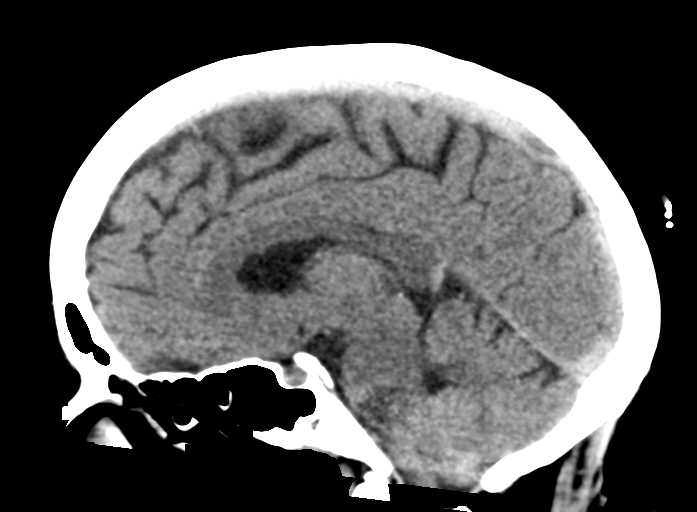
[im 38/57  brain]
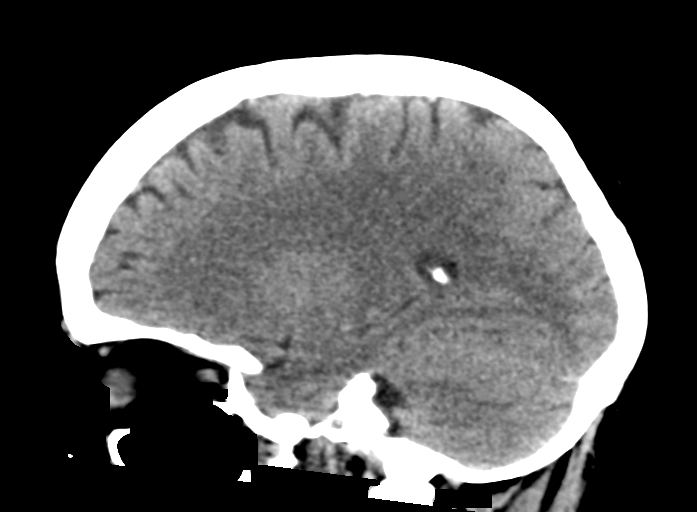

[15 of 47 positions shown; findings below may reference images not displayed]

FINDINGS: Brain: Ventricle size normal. Negative for acute infarct or
hemorrhage.

Calcified dural based lesion in the left frontal convexity
compatible with meningioma. This measures approximately 10 mm and is
similar to the prior study. No brain edema.

Vascular: Negative for hyperdense vessel

Skull: Negative

Sinuses/Orbits: Mild mucosal edema paranasal sinuses.  Normal orbit.

Other: None
IMPRESSION: No acute abnormality

1 cm calcified meningioma left frontal convexity unchanged.

## 2021-07-09 ENCOUNTER — Other Ambulatory Visit: Payer: Self-pay | Admitting: Rheumatology

## 2021-07-09 DIAGNOSIS — R7989 Other specified abnormal findings of blood chemistry: Secondary | ICD-10-CM

## 2021-07-30 ENCOUNTER — Ambulatory Visit
Admission: RE | Admit: 2021-07-30 | Discharge: 2021-07-30 | Disposition: A | Discharge status: Home / Self Care | Payer: BC Managed Care – PPO | Source: Ambulatory Visit | Attending: Rheumatology | Admitting: Rheumatology

## 2021-07-30 DIAGNOSIS — R7989 Other specified abnormal findings of blood chemistry: Secondary | ICD-10-CM

## 2023-05-22 IMAGING — US US ABDOMEN COMPLETE
1 series · 14 of 25 positions shown · non-contrast
Comparison: None.

CLINICAL DATA: Elevated LFTs

EXAM:
ABDOMEN ULTRASOUND COMPLETE

[Series 1: us abdomen complete · 0.23mm/px · 14 of 67 slices shown]
[im 1/67]
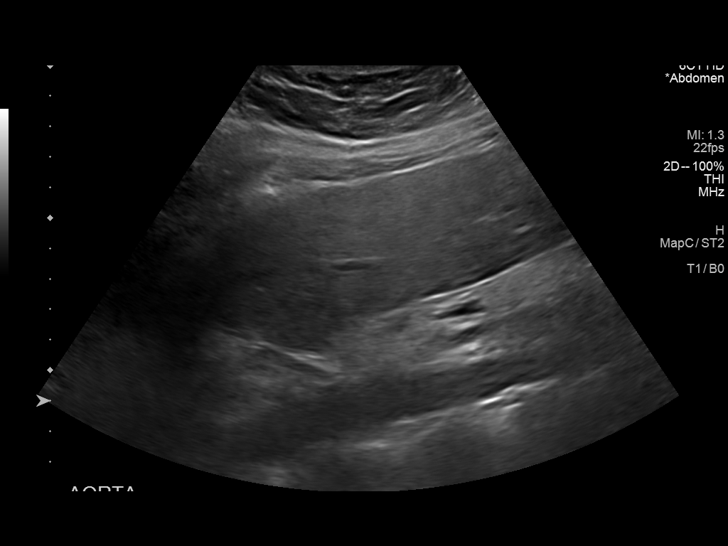
[im 6/67]
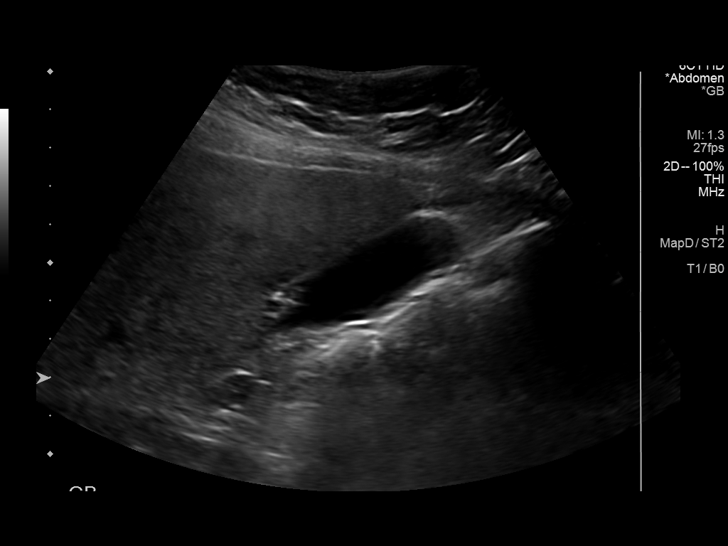
[im 12/67]
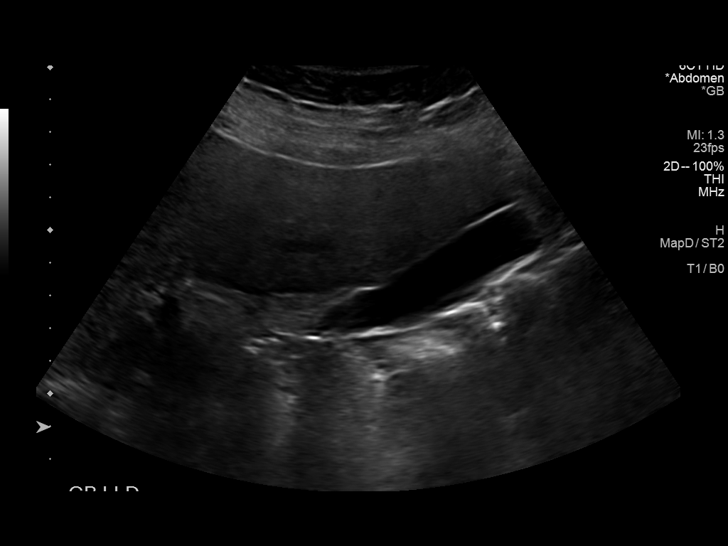
[im 17/67]
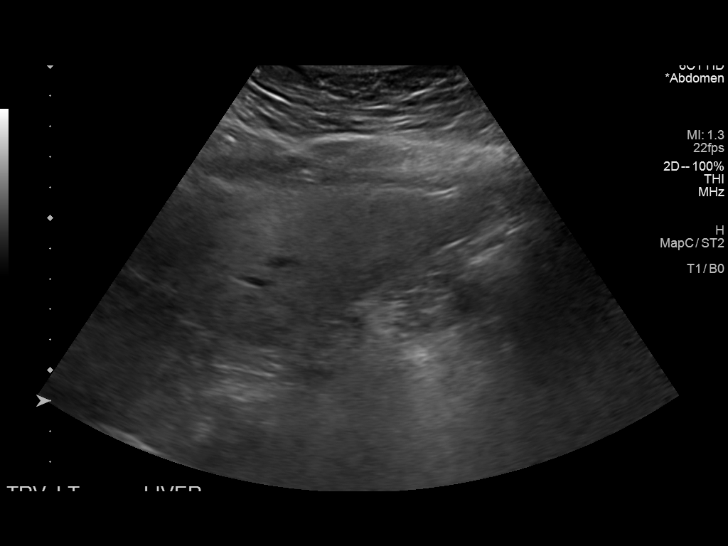
[im 23/67]
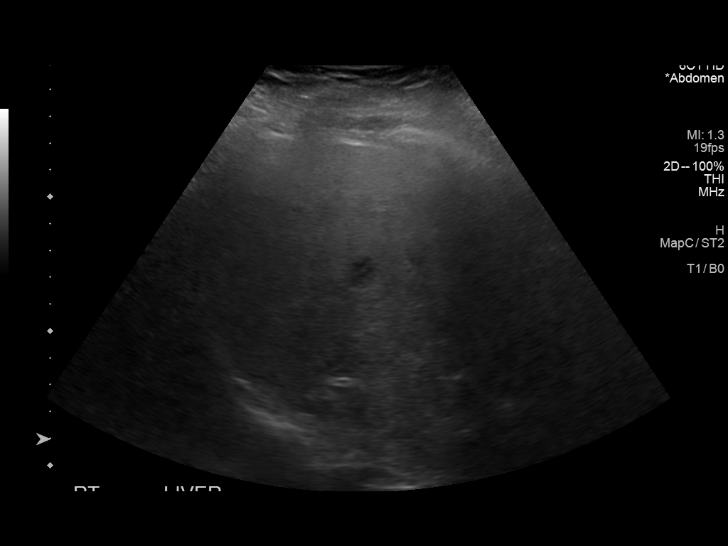
[im 25/67]
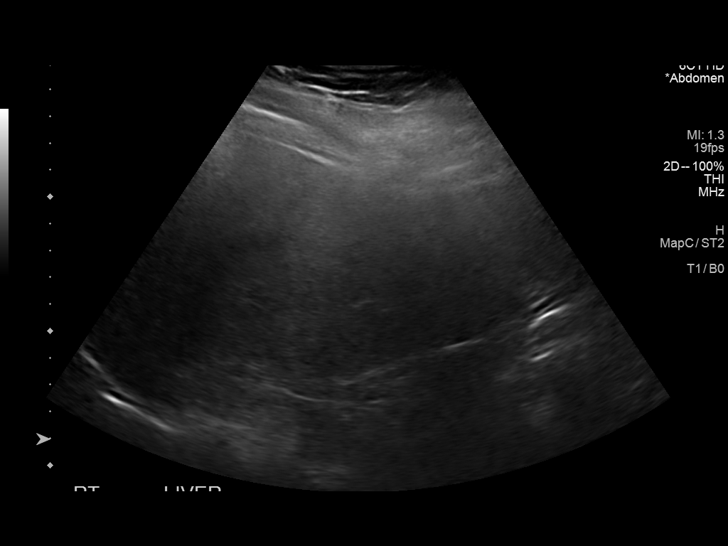
[im 31/67]
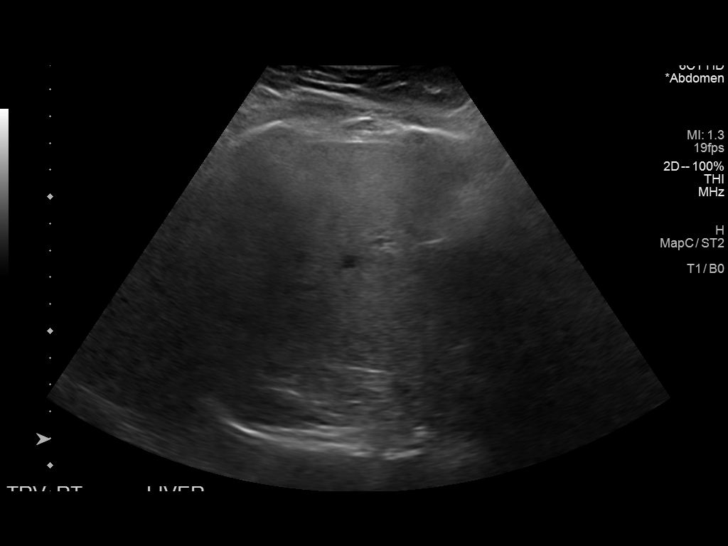
[im 36/67]
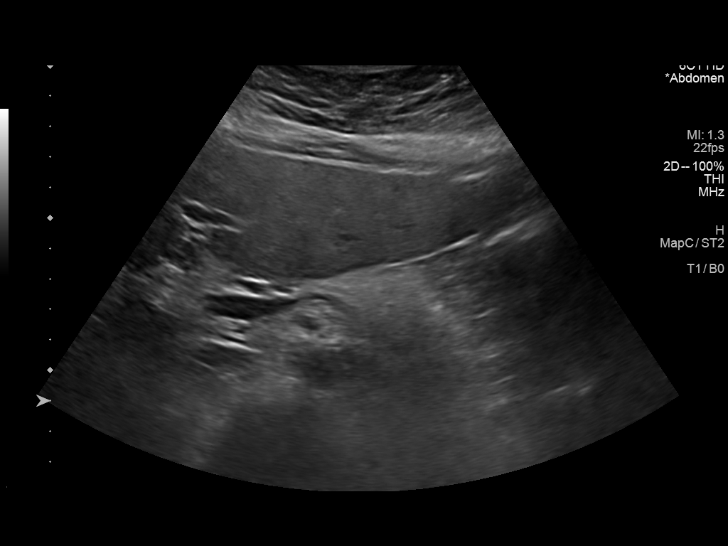
[im 42/67]
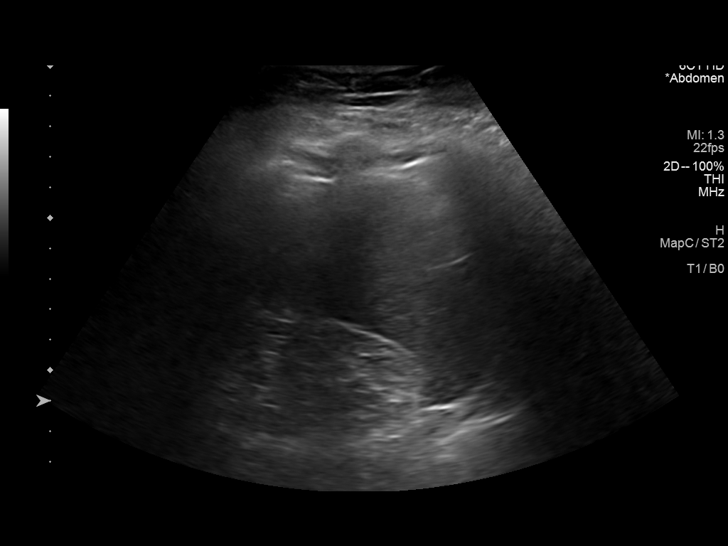
[im 45/67]
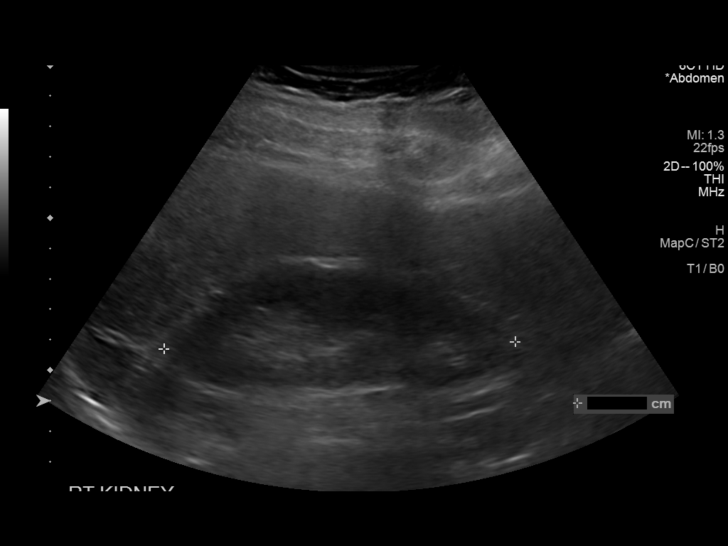
[im 50/67]
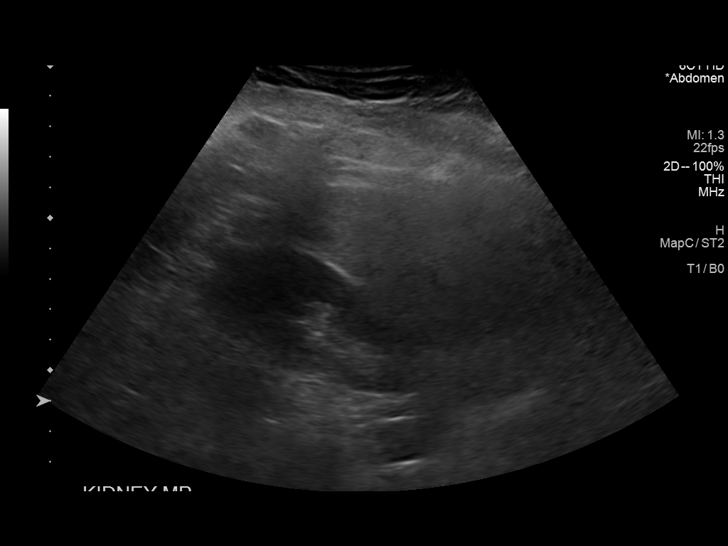
[im 56/67]
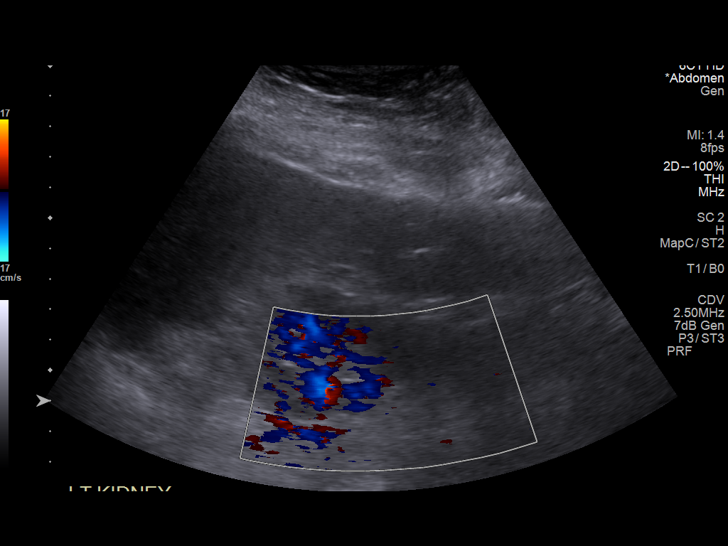
[im 61/67]
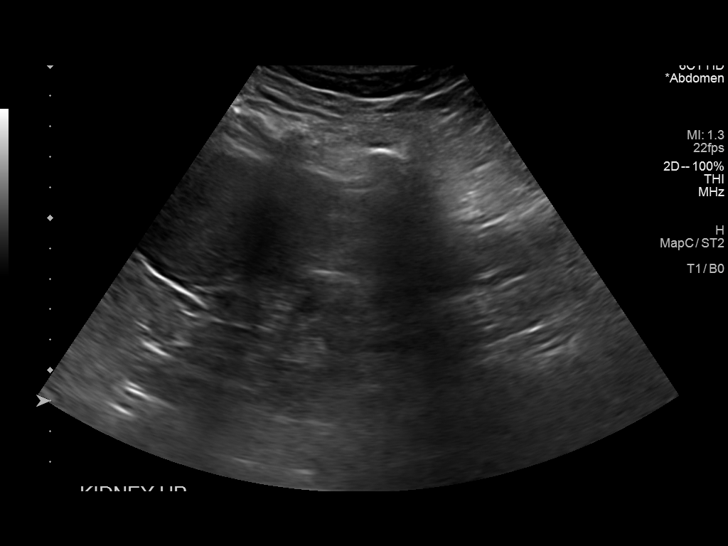
[im 67/67]
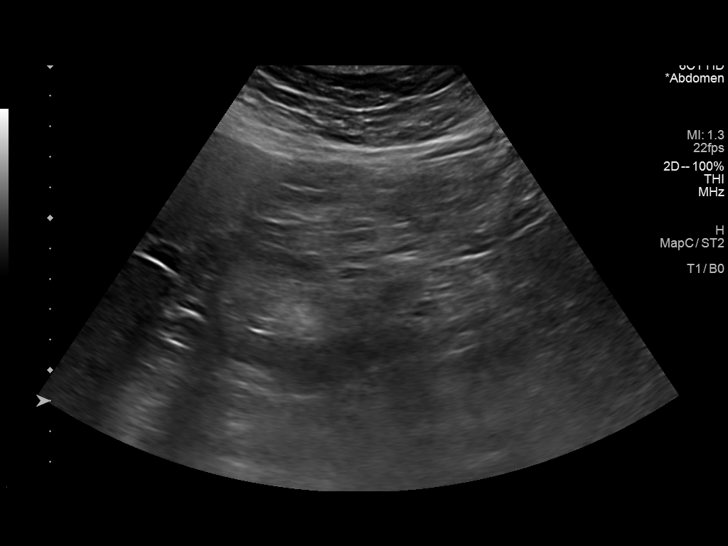

[14 of 25 positions shown; findings below may reference images not displayed]

FINDINGS: Gallbladder: No gallstones or wall thickening visualized. No
sonographic Murphy sign noted by sonographer.

Common bile duct: Diameter: Normal caliber, 2 mm

Liver: Increased echotexture compatible with fatty infiltration. No
focal abnormality or biliary ductal dilatation. Portal vein is
patent on color Doppler imaging with normal direction of blood flow
towards the liver.

IVC: No abnormality visualized.

Pancreas: Visualized portion unremarkable.

Spleen: Size and appearance within normal limits.

Right Kidney: Length: 11.5. Echogenicity within normal limits. No
mass or hydronephrosis visualized.

Left Kidney: Length: 9.7. Echogenicity within normal limits. No mass
or hydronephrosis visualized.

Abdominal aorta: No aneurysm visualized.

Other findings: None.
IMPRESSION: Hepatic steatosis.

No acute findings.

## 2023-12-06 ENCOUNTER — Emergency Department (HOSPITAL_COMMUNITY)
Admission: EM | Admit: 2023-12-06 | Discharge: 2023-12-06 | Disposition: A | Discharge status: Home / Self Care | Attending: Emergency Medicine | Admitting: Emergency Medicine

## 2023-12-06 ENCOUNTER — Encounter (HOSPITAL_COMMUNITY): Payer: Self-pay | Admitting: *Deleted

## 2023-12-06 ENCOUNTER — Emergency Department (HOSPITAL_COMMUNITY)

## 2023-12-06 ENCOUNTER — Other Ambulatory Visit: Payer: Self-pay

## 2023-12-06 DIAGNOSIS — K5732 Diverticulitis of large intestine without perforation or abscess without bleeding: Secondary | ICD-10-CM | POA: Diagnosis not present

## 2023-12-06 DIAGNOSIS — R1084 Generalized abdominal pain: Secondary | ICD-10-CM | POA: Diagnosis present

## 2023-12-06 DIAGNOSIS — K5792 Diverticulitis of intestine, part unspecified, without perforation or abscess without bleeding: Secondary | ICD-10-CM

## 2023-12-06 LAB — COMPREHENSIVE METABOLIC PANEL WITH GFR
ALT: 21 U/L (ref 0–44)
AST: 23 U/L (ref 15–41)
Albumin: 3.3 g/dL — ABNORMAL LOW (ref 3.5–5.0)
Alkaline Phosphatase: 77 U/L (ref 38–126)
Anion gap: 11 (ref 5–15)
BUN: 16 mg/dL (ref 8–23)
CO2: 19 mmol/L — ABNORMAL LOW (ref 22–32)
Calcium: 8.9 mg/dL (ref 8.9–10.3)
Chloride: 103 mmol/L (ref 98–111)
Creatinine, Ser: 1 mg/dL (ref 0.44–1.00)
GFR, Estimated: >60 mL/min (ref >60)
Glucose, Bld: 124 mg/dL — ABNORMAL HIGH (ref 70–99)
Potassium: 3.9 mmol/L (ref 3.5–5.1)
Sodium: 133 mmol/L — ABNORMAL LOW (ref 135–145)
Total Bilirubin: 0.9 mg/dL (ref 0.0–1.2)
Total Protein: 8.8 g/dL — ABNORMAL HIGH (ref 6.5–8.1)

## 2023-12-06 LAB — URINALYSIS, ROUTINE W REFLEX MICROSCOPIC
Bilirubin Urine: NEGATIVE
Glucose, UA: NEGATIVE mg/dL
Hgb urine dipstick: NEGATIVE
Ketones, ur: NEGATIVE mg/dL
Leukocytes,Ua: NEGATIVE
Nitrite: NEGATIVE
Protein, ur: NEGATIVE mg/dL
Specific Gravity, Urine: >1.046 — ABNORMAL HIGH (ref 1.005–1.030)
pH: 6 (ref 5.0–8.0)

## 2023-12-06 LAB — CBC
HCT: 46.5 % — ABNORMAL HIGH (ref 36.0–46.0)
Hemoglobin: 15.6 g/dL — ABNORMAL HIGH (ref 12.0–15.0)
MCH: 31.6 pg (ref 26.0–34.0)
MCHC: 33.5 g/dL (ref 30.0–36.0)
MCV: 94.1 fL (ref 80.0–100.0)
Platelets: 315 10*3/uL (ref 150–400)
RBC: 4.94 MIL/uL (ref 3.87–5.11)
RDW: 12.2 % (ref 11.5–15.5)
WBC: 17.4 10*3/uL — ABNORMAL HIGH (ref 4.0–10.5)
nRBC: 0 % (ref 0.0–0.2)

## 2023-12-06 LAB — LIPASE, BLOOD: Lipase: 30 U/L (ref 11–51)

## 2023-12-06 MED ORDER — METRONIDAZOLE 500 MG/100ML IV SOLN
500.0000 mg | Freq: Once | INTRAVENOUS | Status: AC
  Administered 2023-12-06: 500 mg via INTRAVENOUS
  Filled 2023-12-06: qty 100

## 2023-12-06 MED ORDER — CIPROFLOXACIN HCL 500 MG PO TABS: 500.0000 mg | ORAL_TABLET | Freq: Two times a day (BID) | ORAL | 0 refills | Status: AC

## 2023-12-06 MED ORDER — HYDROMORPHONE HCL 1 MG/ML IJ SOLN
1.0000 mg | Freq: Once | INTRAMUSCULAR | Status: AC
  Administered 2023-12-06: 1 mg via INTRAVENOUS
  Filled 2023-12-06: qty 1

## 2023-12-06 MED ORDER — ONDANSETRON HCL 4 MG/2ML IJ SOLN
4.0000 mg | Freq: Once | INTRAMUSCULAR | Status: AC
  Administered 2023-12-06: 4 mg via INTRAVENOUS
  Filled 2023-12-06: qty 2

## 2023-12-06 MED ORDER — IOHEXOL 350 MG/ML SOLN
75.0000 mL | Freq: Once | INTRAVENOUS | Status: AC | PRN
  Administered 2023-12-06: 75 mL via INTRAVENOUS

## 2023-12-06 MED ORDER — SODIUM CHLORIDE 0.9 % IV BOLUS
1000.0000 mL | Freq: Once | INTRAVENOUS | Status: AC
  Administered 2023-12-06: 1000 mL via INTRAVENOUS

## 2023-12-06 MED ORDER — METRONIDAZOLE 500 MG PO TABS
500.0000 mg | ORAL_TABLET | Freq: Three times a day (TID) | ORAL | 0 refills | Status: AC
Start: 2023-12-07 — End: 2023-12-14

## 2023-12-06 MED ORDER — CIPROFLOXACIN IN D5W 400 MG/200ML IV SOLN
400.0000 mg | Freq: Once | INTRAVENOUS | Status: AC
  Administered 2023-12-06: 400 mg via INTRAVENOUS
  Filled 2023-12-06: qty 200

## 2023-12-06 NOTE — ED Triage Notes (Signed)
 The pt is c/o abd pain for 2 weeks   no bm for one week small bm today  she was seen by a doctor that did lab work   and an ultrasound 2 weeks ago but she has not heard anything from her  she is in [place]

## 2023-12-06 NOTE — ED Notes (Signed)
 ED Provider at bedside.

## 2023-12-06 NOTE — Discharge Instructions (Addendum)
 Please schedule follow-up appointment with your doctor to recheck your symptoms this week.  You are being treated with antibiotics for diverticulitis, which is inflammation or infection of the colon.  Please stick to a liquid and soft food diet for the next 2 days, drinking plenty of water to stay hydrated.  If your pain is getting worse, or if you are having fevers, dizziness, lightheadedness, bloody stools, please return to the emergency department

## 2023-12-06 NOTE — ED Notes (Signed)
 Patient transported to CT

## 2023-12-06 NOTE — ED Provider Notes (Signed)
 [place] [facility] Provider Note   CSN: [id number] Arrival date & time: 12/06/23  1730     History  Chief Complaint  Patient presents with   Abdominal Pain    April  [name] is a 63 y.o. female with history of chronic back and shoulder pain, presenting to the ED with concern for abdominal pain and bloating, constipation.  Patient reports that she has not had a bowel movement in about 5 days aside from a very small pebble bowel movement after taking laxatives 3 days ago.  Typically she has 6 loose bowel movements a day.  She reports abdomen is bloated.  She has diffuse abdominal pain that is intolerable.  She has nauseated and unable to eat or drink anything.  She reports that her PCP did arrange for a gallbladder ultrasound about 2 weeks ago and she is not aware of the results of the ultrasound.  Patient is on chronic pain medicine for back and shoulder issues and is seeing rheumatology as well, and is on methotrexate typically as well.  No changes in her chronic medications.  No history of abdominal surgery per patient report  HPI     Home Medications Prior to Admission medications   Medication Sig Start Date End Date Taking? Authorizing Provider  ciprofloxacin (CIPRO) 500 MG tablet Take 1 tablet (500 mg total) by mouth every 12 (twelve) hours for 7 days. 12/07/23 12/14/23 Yes Halston Kintz, Janalyn Me, MD  metroNIDAZOLE (FLAGYL) 500 MG tablet Take 1 tablet (500 mg total) by mouth 3 (three) times daily for 7 days. 12/07/23 12/14/23 Yes Marolyn Urschel, Janalyn Me, MD  albuterol [facility] HFA) 108 (90 Base) MCG/ACT inhaler 2 puffs 4 (four) times daily.    [provider]  famotidine (PEPCID) 20 MG tablet Take 20 mg by mouth. Take two tablets daily.    [provider]  folic acid (FOLVITE) 1 MG tablet Take 1 mg by mouth daily. 11/04/16   [provider]  HYDROcodone-acetaminophen  (NORCO) 5-325 MG tablet Taking 1 TID PRN    [provider]   hydroxychloroquine (PLAQUENIL) 200 MG tablet hydroxychloroquine 200 mg tablet  Take 2 tablets every day by oral route.    [provider]  ipratropium-albuterol (DUONEB) 0.5-2.5 (3) MG/3ML SOLN 4 (four) times daily.    [provider]  methotrexate (RHEUMATREX) 2.5 MG tablet Take 25 mg by mouth once a week. 11/11/16   [provider]  ondansetron  (ZOFRAN ) 4 MG tablet Take 1 tablet (4 mg total) by mouth every 8 (eight) hours as needed for nausea or vomiting. 06/06/19   Wess Hammed, NP  pregabalin (LYRICA) 225 MG capsule Take 225 mg by mouth 2 (two) times daily. 05/13/19   [provider]  rizatriptan  (MAXALT ) 10 MG tablet Take 1 tablet (10 mg total) by mouth as needed for migraine. May repeat in 2 hours if needed 06/06/19   Wess Hammed, NP  venlafaxine  XR (EFFEXOR  XR) 150 MG 24 hr capsule Take 1 capsule (150 mg total) by mouth daily with breakfast. 06/06/19   Wess Hammed, NP      Allergies    Aimovig  [erenumab -aooe], Aspirin, Cephalexin, Erythromycin base, Gabapentin, Sulfa antibiotics, and Topamax  [topiramate ]    Review of Systems   Review of Systems  Physical Exam Updated Vital Signs BP 124/65 (BP Location: Right Arm)   Pulse 82   Temp 98.8 F (37.1 C) (Oral)   Resp 18   Ht 5\' 1"  (1.549 m)  Wt 87.3 kg   SpO2 95%   BMI 36.37 kg/m  Physical Exam Constitutional:      General: She is not in acute distress. HENT:     Head: Normocephalic and atraumatic.  Eyes:     Conjunctiva/sclera: Conjunctivae normal.     Pupils: Pupils are equal, round, and reactive to light.  Cardiovascular:     Rate and Rhythm: Normal rate and regular rhythm.  Pulmonary:     Effort: Pulmonary effort is normal. No respiratory distress.  Abdominal:     General: There is distension.     Tenderness: There is generalized abdominal tenderness.  Skin:    General: Skin is warm and dry.  Neurological:     General: No focal deficit present.     Mental Status: She is  alert. Mental status is at baseline.  Psychiatric:        Mood and Affect: Mood normal.        Behavior: Behavior normal.     ED Results / Procedures / Treatments   Labs (all labs ordered are listed, but only abnormal results are displayed) Labs Reviewed  COMPREHENSIVE METABOLIC PANEL WITH GFR - Abnormal; Notable for the following components:      Result Value   Sodium 133 (*)    CO2 19 (*)    Glucose, Bld 124 (*)    Total Protein 8.8 (*)    Albumin 3.3 (*)    All other components within normal limits  CBC - Abnormal; Notable for the following components:   WBC 17.4 (*)    Hemoglobin 15.6 (*)    HCT 46.5 (*)    All other components within normal limits  URINALYSIS, ROUTINE W REFLEX MICROSCOPIC - Abnormal; Notable for the following components:   Specific Gravity, Urine >1.046 (*)    All other components within normal limits  LIPASE, BLOOD    EKG None  Radiology CT ABDOMEN PELVIS W CONTRAST Result Date: 12/06/2023 CLINICAL DATA:  Diffuse abdominal tenderness. EXAM: CT ABDOMEN AND PELVIS WITH CONTRAST TECHNIQUE: Multidetector CT imaging of the abdomen and pelvis was performed using the standard protocol following bolus administration of intravenous contrast. RADIATION DOSE REDUCTION: This exam was performed according to the departmental dose-optimization program which includes automated exposure control, adjustment of the mA and/or kV according to patient size and/or use of iterative reconstruction technique. CONTRAST:  75mL OMNIPAQUE IOHEXOL 350 MG/ML SOLN COMPARISON:  None Available. FINDINGS: Lower chest: No acute abnormality. Hepatobiliary: There is diffuse fatty infiltration of the liver parenchyma. No focal liver abnormality is seen. No gallstones, gallbladder wall thickening, or biliary dilatation. Pancreas: Unremarkable. No pancreatic ductal dilatation or surrounding inflammatory changes. Spleen: Normal in size without focal abnormality. Adrenals/Urinary Tract: Adrenal glands  are unremarkable. Kidneys are normal in size, without renal calculi or hydronephrosis. Small bilateral simple renal cysts are seen. Bladder is unremarkable. Stomach/Bowel: Stomach is within normal limits. The appendix is surgically absent. There is no evidence of bowel dilatation. A short segment of markedly inflamed mid sigmoid colon is seen. Numerous sigmoid colon diverticula are also present. There is no evidence of associated perforation or abscess. Vascular/Lymphatic: No significant vascular findings are present. No enlarged abdominal or pelvic lymph nodes. Reproductive: Status post hysterectomy. No adnexal masses. Other: No abdominal wall hernia or abnormality. No abdominopelvic ascites. Musculoskeletal: No acute or significant osseous findings. IMPRESSION: 1. Marked severity sigmoid diverticulitis. 2. Hepatic steatosis. 3. Small bilateral simple renal cysts. No follow-up imaging is recommended. This recommendation follows ACR consensus guidelines: Management  of the Incidental Renal Mass on CT: A White Paper of the ACR Incidental Findings Committee. J Am Coll Radiol [phone]. Electronically Signed   By: Virgle Grime M.D.   On: 12/06/2023 21:20    Procedures Procedures    Medications Ordered in ED Medications  ciprofloxacin (CIPRO) IVPB 400 mg (400 mg Intravenous New Bag/Given 12/06/23 2200)  metroNIDAZOLE (FLAGYL) IVPB 500 mg (500 mg Intravenous New Bag/Given 12/06/23 2200)  sodium chloride  0.9 % bolus 1,000 mL (0 mLs Intravenous Stopped 12/06/23 2134)  HYDROmorphone (DILAUDID) injection 1 mg (1 mg Intravenous Given 12/06/23 1852)  ondansetron  (ZOFRAN ) injection 4 mg (4 mg Intravenous Given 12/06/23 1853)  iohexol (OMNIPAQUE) 350 MG/ML injection 75 mL (75 mLs Intravenous Contrast Given 12/06/23 1946)    ED Course/ Medical Decision Making/ A&P                                 Medical Decision Making Amount and/or Complexity of Data Reviewed Labs: ordered. Radiology:  ordered.  Risk Prescription drug management.   This patient presents to the ED with concern for neurolyse abdominal pain, distention, tenderness, nausea, constipation. This involves an extensive number of treatment options, and is a complaint that carries with it a high risk of complications and morbidity.  The differential diagnosis includes colitis versus colitis versus bowel obstruction versus biliary disease versus pancreatitis versus other intra-abdominal process  External records from outside source obtained and reviewed including most recent ultrasound visible to me of the right upper quadrant or abdomen noted no gallstones or abnormal gallbladder findings in January 2023.  I ordered and personally interpreted labs.  The pertinent results include: White blood cell count 17.4.  UA without sign of infection.  LFTs and lipase unremarkable  I ordered imaging studies including CT abdomen pelvis with contrast I independently visualized and interpreted imaging which showed marked sigmoid diverticulitis, hepatic steatosis I agree with the radiologist interpretation  The patient was maintained on a cardiac monitor.  I personally viewed and interpreted the cardiac monitored which showed an underlying rhythm of: Sinus rhythm  I ordered medication including IV pain and nausea medication, IV fluid bolus, IV antibiotics for intra-abdominal infection  I have reviewed the patients home medicines and have made adjustments as needed  Test Considered: Doubt pelvic pathology as a cause of the patient's symptoms  After the interventions noted above, I reevaluated the patient and found that they have: improved -pain improved and tolerating p.o.  Hospitalization was considered and offered to the patient.  I do have some concerns about her level of pain, again, and the degree of inflammation and leukocytosis seen on CT.  I thought it would be safest to admit her to observation overnight to monitor her pain  and recheck her white blood cell count in the morning.  However after discussing the plan with the patient and her husband, they have instead chosen to go home on oral antibiotics. This is still reasonable as she is tolerating PO and feels she can manage the pain otherwise.  They understand that if her symptoms worsen she needs to return immediately to the emergency department or hospital.  We discussed the risks of perforation -of which she has no sign at this time, but return precautions were discussed.  They verbalized understanding.  She will complete ciprofloxacin and Flagyl IV in the ED and then will resume this medication orally at home tomorrow.  Dispostiion:  After consideration of  the diagnostic results and the patients response to treatment, I feel that the patient would benefit from close outpatient follow-up         Final Clinical Impression(s) / ED Diagnoses Final diagnoses:  Diverticulitis    Rx / DC Orders ED Discharge Orders          Ordered    ciprofloxacin (CIPRO) 500 MG tablet  Every 12 hours        12/06/23 2212    metroNIDAZOLE (FLAGYL) 500 MG tablet  3 times daily        12/06/23 2212              Arvilla Birmingham, MD 12/06/23 2228

## 2023-12-06 NOTE — ED Notes (Signed)
 Patient is aware we are in need of a urine sample; will call out when able.
